# Patient Record
Sex: Female | Born: 1965 | Race: White | Hispanic: No | Marital: Married | Smoking: Never smoker
Health system: Southern US, Community
[De-identification: ages and names within clinical notes are randomized; demographics above are authoritative.]

## PROBLEM LIST (undated history)

## (undated) DIAGNOSIS — T7840XA Allergy, unspecified, initial encounter: Secondary | ICD-10-CM

## (undated) DIAGNOSIS — E079 Disorder of thyroid, unspecified: Secondary | ICD-10-CM

## (undated) DIAGNOSIS — M1990 Unspecified osteoarthritis, unspecified site: Secondary | ICD-10-CM

## (undated) DIAGNOSIS — M810 Age-related osteoporosis without current pathological fracture: Secondary | ICD-10-CM

## (undated) DIAGNOSIS — I10 Essential (primary) hypertension: Secondary | ICD-10-CM

## (undated) DIAGNOSIS — E669 Obesity, unspecified: Secondary | ICD-10-CM

## (undated) DIAGNOSIS — D649 Anemia, unspecified: Secondary | ICD-10-CM

## (undated) HISTORY — DX: Unspecified osteoarthritis, unspecified site: M19.90

## (undated) HISTORY — DX: Age-related osteoporosis without current pathological fracture: M81.0

## (undated) HISTORY — PX: OTHER SURGICAL HISTORY: SHX169

## (undated) HISTORY — DX: Allergy, unspecified, initial encounter: T78.40XA

## (undated) HISTORY — DX: Essential (primary) hypertension: I10

## (undated) HISTORY — DX: Disorder of thyroid, unspecified: E07.9

## (undated) HISTORY — DX: Obesity, unspecified: E66.9

## (undated) HISTORY — DX: Anemia, unspecified: D64.9

## (undated) HISTORY — PX: CERVICAL BIOPSY  W/ LOOP ELECTRODE EXCISION: SUR135

---

## 2000-10-11 ENCOUNTER — Other Ambulatory Visit: Admission: RE | Admit: 2000-10-11 | Discharge: 2000-10-11 | Payer: Self-pay | Admitting: Obstetrics and Gynecology

## 2001-12-09 ENCOUNTER — Other Ambulatory Visit: Admission: RE | Admit: 2001-12-09 | Discharge: 2001-12-09 | Payer: Self-pay | Admitting: Obstetrics and Gynecology

## 2002-01-07 ENCOUNTER — Ambulatory Visit (HOSPITAL_COMMUNITY): Admission: RE | Admit: 2002-01-07 | Discharge: 2002-01-07 | Payer: Self-pay | Admitting: Family Medicine

## 2002-01-07 ENCOUNTER — Encounter: Payer: Self-pay | Admitting: Family Medicine

## 2002-01-14 ENCOUNTER — Ambulatory Visit (HOSPITAL_COMMUNITY): Admission: RE | Admit: 2002-01-14 | Discharge: 2002-01-14 | Payer: Self-pay | Admitting: Family Medicine

## 2002-01-15 ENCOUNTER — Encounter: Payer: Self-pay | Admitting: Family Medicine

## 2002-03-05 ENCOUNTER — Other Ambulatory Visit: Admission: RE | Admit: 2002-03-05 | Discharge: 2002-03-05 | Payer: Self-pay | Admitting: Obstetrics and Gynecology

## 2002-03-17 ENCOUNTER — Ambulatory Visit (HOSPITAL_COMMUNITY): Admission: RE | Admit: 2002-03-17 | Discharge: 2002-03-17 | Payer: Self-pay | Admitting: Internal Medicine

## 2002-06-16 ENCOUNTER — Ambulatory Visit (HOSPITAL_COMMUNITY): Admission: RE | Admit: 2002-06-16 | Discharge: 2002-06-16 | Payer: Self-pay | Admitting: Internal Medicine

## 2002-06-29 ENCOUNTER — Encounter (HOSPITAL_COMMUNITY): Admission: RE | Admit: 2002-06-29 | Discharge: 2002-07-29 | Payer: Self-pay | Admitting: Internal Medicine

## 2002-12-15 ENCOUNTER — Other Ambulatory Visit: Admission: RE | Admit: 2002-12-15 | Discharge: 2002-12-15 | Payer: Self-pay | Admitting: Obstetrics and Gynecology

## 2004-06-13 ENCOUNTER — Other Ambulatory Visit: Admission: RE | Admit: 2004-06-13 | Discharge: 2004-06-13 | Payer: Self-pay | Admitting: Obstetrics and Gynecology

## 2005-05-22 ENCOUNTER — Other Ambulatory Visit: Admission: RE | Admit: 2005-05-22 | Discharge: 2005-05-22 | Payer: Self-pay | Admitting: Obstetrics and Gynecology

## 2006-10-01 ENCOUNTER — Encounter: Payer: Self-pay | Admitting: Family Medicine

## 2006-10-01 ENCOUNTER — Ambulatory Visit: Payer: Self-pay | Admitting: Family Medicine

## 2006-10-01 ENCOUNTER — Other Ambulatory Visit: Admission: RE | Admit: 2006-10-01 | Discharge: 2006-10-01 | Payer: Self-pay | Admitting: Family Medicine

## 2006-10-01 LAB — CONVERTED CEMR LAB
BUN: 10 mg/dL (ref 6–23)
Basophils Absolute: 0 10*3/uL (ref 0.0–0.1)
Basophils Relative: 0.4 % (ref 0.0–1.0)
Cholesterol: 279 mg/dL (ref 0–200)
Creatinine, Ser: 1.1 mg/dL (ref 0.4–1.2)
Eosinophils Absolute: 0.1 10*3/uL (ref 0.0–0.6)
HCT: 36.8 % (ref 36.0–46.0)
MCHC: 35.4 g/dL (ref 30.0–36.0)
MCV: 93.3 fL (ref 78.0–100.0)
Neutro Abs: 4 10*3/uL (ref 1.4–7.7)
Neutrophils Relative %: 60 % (ref 43.0–77.0)
Platelets: 234 10*3/uL (ref 150–400)
Potassium: 3.8 meq/L (ref 3.5–5.1)
TSH: 77.58 microintl units/mL — ABNORMAL HIGH (ref 0.35–5.50)
Total CHOL/HDL Ratio: 5.7
Triglycerides: 159 mg/dL — ABNORMAL HIGH (ref 0–149)
VLDL: 32 mg/dL (ref 0–40)

## 2006-10-08 ENCOUNTER — Ambulatory Visit: Payer: Self-pay | Admitting: Family Medicine

## 2006-10-16 ENCOUNTER — Encounter: Admission: RE | Admit: 2006-10-16 | Discharge: 2006-10-16 | Payer: Self-pay | Admitting: Family Medicine

## 2006-11-05 ENCOUNTER — Ambulatory Visit: Payer: Self-pay | Admitting: Family Medicine

## 2006-11-06 LAB — CONVERTED CEMR LAB: T3, Free: 2.5 pg/mL (ref 2.3–4.2)

## 2007-01-23 ENCOUNTER — Ambulatory Visit: Payer: Self-pay | Admitting: Family Medicine

## 2007-01-23 LAB — CONVERTED CEMR LAB: TSH: 12.83 microintl units/mL — ABNORMAL HIGH (ref 0.35–5.50)

## 2007-03-14 ENCOUNTER — Ambulatory Visit: Payer: Self-pay | Admitting: Family Medicine

## 2007-03-14 LAB — CONVERTED CEMR LAB: TSH: 6.58 microintl units/mL — ABNORMAL HIGH (ref 0.35–5.50)

## 2007-05-27 DIAGNOSIS — E039 Hypothyroidism, unspecified: Secondary | ICD-10-CM | POA: Insufficient documentation

## 2007-05-27 DIAGNOSIS — R51 Headache: Secondary | ICD-10-CM

## 2007-05-27 DIAGNOSIS — I10 Essential (primary) hypertension: Secondary | ICD-10-CM | POA: Insufficient documentation

## 2007-05-27 DIAGNOSIS — R519 Headache, unspecified: Secondary | ICD-10-CM | POA: Insufficient documentation

## 2007-09-17 ENCOUNTER — Telehealth: Payer: Self-pay | Admitting: Family Medicine

## 2007-09-19 ENCOUNTER — Ambulatory Visit: Payer: Self-pay | Admitting: Internal Medicine

## 2007-09-19 DIAGNOSIS — K120 Recurrent oral aphthae: Secondary | ICD-10-CM | POA: Insufficient documentation

## 2007-09-26 ENCOUNTER — Encounter: Admission: RE | Admit: 2007-09-26 | Discharge: 2007-09-26 | Payer: Self-pay | Admitting: Family Medicine

## 2007-09-26 ENCOUNTER — Ambulatory Visit: Payer: Self-pay | Admitting: Family Medicine

## 2007-09-26 LAB — CONVERTED CEMR LAB
ALT: 10 units/L (ref 0–35)
AST: 16 units/L (ref 0–37)
Basophils Relative: 0.3 % (ref 0.0–1.0)
Bilirubin, Direct: 0.1 mg/dL (ref 0.0–0.3)
CO2: 28 meq/L (ref 19–32)
Direct LDL: 164.5 mg/dL
Eosinophils Absolute: 0.1 10*3/uL (ref 0.0–0.6)
GFR calc Af Amer: 79 mL/min
GFR calc non Af Amer: 65 mL/min
HDL: 45.2 mg/dL (ref >39.0)
Hemoglobin: 12.2 g/dL (ref 12.0–15.0)
Lymphocytes Relative: 34.2 % (ref 12.0–46.0)
Monocytes Absolute: 0.4 10*3/uL (ref 0.2–0.7)
Monocytes Relative: 6.9 % (ref 3.0–11.0)
Neutro Abs: 3.7 10*3/uL (ref 1.4–7.7)
Neutrophils Relative %: 57.8 % (ref 43.0–77.0)
Platelets: 279 10*3/uL (ref 150–400)
Potassium: 4.2 meq/L (ref 3.5–5.1)
RBC: 3.91 M/uL (ref 3.87–5.11)
Sodium: 141 meq/L (ref 135–145)
Total Protein: 7.2 g/dL (ref 6.0–8.3)
Triglycerides: 113 mg/dL (ref 0–149)
VLDL: 23 mg/dL (ref 0–40)

## 2007-10-30 ENCOUNTER — Ambulatory Visit: Payer: Self-pay | Admitting: Family Medicine

## 2007-10-30 ENCOUNTER — Encounter: Payer: Self-pay | Admitting: Family Medicine

## 2007-10-30 DIAGNOSIS — F988 Other specified behavioral and emotional disorders with onset usually occurring in childhood and adolescence: Secondary | ICD-10-CM | POA: Insufficient documentation

## 2007-10-30 DIAGNOSIS — F341 Dysthymic disorder: Secondary | ICD-10-CM | POA: Insufficient documentation

## 2007-10-30 DIAGNOSIS — E663 Overweight: Secondary | ICD-10-CM | POA: Insufficient documentation

## 2007-10-31 ENCOUNTER — Other Ambulatory Visit: Admission: RE | Admit: 2007-10-31 | Discharge: 2007-10-31 | Payer: Self-pay | Admitting: Family Medicine

## 2008-09-21 ENCOUNTER — Emergency Department (HOSPITAL_COMMUNITY): Admission: EM | Admit: 2008-09-21 | Discharge: 2008-09-21 | Payer: Self-pay | Admitting: Family Medicine

## 2008-10-12 ENCOUNTER — Encounter: Admission: RE | Admit: 2008-10-12 | Discharge: 2008-10-12 | Payer: Self-pay | Admitting: Family Medicine

## 2009-02-04 ENCOUNTER — Ambulatory Visit: Payer: Self-pay | Admitting: Family Medicine

## 2009-02-04 LAB — CONVERTED CEMR LAB
ALT: 9 units/L (ref 0–35)
AST: 14 units/L (ref 0–37)
Albumin: 3.7 g/dL (ref 3.5–5.2)
Alkaline Phosphatase: 81 units/L (ref 39–117)
Bilirubin Urine: NEGATIVE
Calcium: 8.5 mg/dL (ref 8.4–10.5)
Chloride: 111 meq/L (ref 96–112)
Cholesterol: 179 mg/dL (ref 0–200)
Eosinophils Absolute: 0 10*3/uL (ref 0.0–0.7)
GFR calc non Af Amer: 72.66 mL/min (ref >60)
HDL: 41.8 mg/dL (ref >39.00)
Hemoglobin: 12.5 g/dL (ref 12.0–15.0)
Lymphocytes Relative: 32.5 % (ref 12.0–46.0)
Lymphs Abs: 1.1 10*3/uL (ref 0.7–4.0)
MCV: 94.5 fL (ref 78.0–100.0)
Monocytes Relative: 12.1 % — ABNORMAL HIGH (ref 3.0–12.0)
Neutrophils Relative %: 54.2 % (ref 43.0–77.0)
Nitrite: NEGATIVE
Platelets: 214 10*3/uL (ref 150.0–400.0)
RDW: 12.5 % (ref 11.5–14.6)
Specific Gravity, Urine: 1.025
Total Protein: 7.1 g/dL (ref 6.0–8.3)
Uric Acid, Serum: 5.2 mg/dL (ref 2.4–7.0)
Urobilinogen, UA: 0.2
VLDL: 21 mg/dL (ref 0.0–40.0)
WBC Urine, dipstick: NEGATIVE
pH: 7

## 2009-02-11 ENCOUNTER — Encounter: Payer: Self-pay | Admitting: Family Medicine

## 2009-02-11 ENCOUNTER — Other Ambulatory Visit: Admission: RE | Admit: 2009-02-11 | Discharge: 2009-02-11 | Payer: Self-pay | Admitting: Family Medicine

## 2009-02-11 ENCOUNTER — Ambulatory Visit: Payer: Self-pay | Admitting: Family Medicine

## 2009-02-18 ENCOUNTER — Telehealth: Payer: Self-pay | Admitting: *Deleted

## 2009-03-29 ENCOUNTER — Ambulatory Visit: Payer: Self-pay | Admitting: Family Medicine

## 2009-04-07 ENCOUNTER — Telehealth: Payer: Self-pay | Admitting: Family Medicine

## 2009-05-03 ENCOUNTER — Ambulatory Visit: Payer: Self-pay | Admitting: Family Medicine

## 2009-05-03 DIAGNOSIS — M65849 Other synovitis and tenosynovitis, unspecified hand: Secondary | ICD-10-CM

## 2009-05-03 DIAGNOSIS — M65839 Other synovitis and tenosynovitis, unspecified forearm: Secondary | ICD-10-CM | POA: Insufficient documentation

## 2009-05-11 ENCOUNTER — Ambulatory Visit: Payer: Self-pay | Admitting: Family Medicine

## 2009-05-11 DIAGNOSIS — R1013 Epigastric pain: Secondary | ICD-10-CM | POA: Insufficient documentation

## 2009-05-11 LAB — CONVERTED CEMR LAB
Alkaline Phosphatase: 94 units/L (ref 39–117)
Basophils Relative: 0.9 % (ref 0.0–3.0)
CO2: 26 meq/L (ref 19–32)
Calcium: 9.1 mg/dL (ref 8.4–10.5)
Creatinine, Ser: 0.8 mg/dL (ref 0.4–1.2)
Eosinophils Absolute: 0.3 10*3/uL (ref 0.0–0.7)
Hemoglobin: 13.3 g/dL (ref 12.0–15.0)
Lipase: 25 units/L (ref 11.0–59.0)
Lymphs Abs: 2.2 10*3/uL (ref 0.7–4.0)
Monocytes Absolute: 0.5 10*3/uL (ref 0.1–1.0)
Neutro Abs: 3.7 10*3/uL (ref 1.4–7.7)
Neutrophils Relative %: 54 % (ref 43.0–77.0)
RDW: 12.8 % (ref 11.5–14.6)
Total Bilirubin: 0.5 mg/dL (ref 0.3–1.2)
Total Protein: 7.3 g/dL (ref 6.0–8.3)

## 2009-05-12 ENCOUNTER — Encounter: Admission: RE | Admit: 2009-05-12 | Discharge: 2009-05-12 | Payer: Self-pay | Admitting: Family Medicine

## 2009-05-13 ENCOUNTER — Ambulatory Visit (HOSPITAL_COMMUNITY): Admission: RE | Admit: 2009-05-13 | Discharge: 2009-05-13 | Payer: Self-pay | Admitting: Family Medicine

## 2009-05-16 ENCOUNTER — Ambulatory Visit: Payer: Self-pay | Admitting: Family Medicine

## 2009-10-11 ENCOUNTER — Encounter: Admission: RE | Admit: 2009-10-11 | Discharge: 2009-10-11 | Payer: Self-pay | Admitting: Family Medicine

## 2010-04-03 ENCOUNTER — Ambulatory Visit: Payer: Self-pay | Admitting: Family Medicine

## 2010-04-03 LAB — CONVERTED CEMR LAB
ALT: 12 units/L (ref 0–35)
AST: 19 units/L (ref 0–37)
Albumin: 3.9 g/dL (ref 3.5–5.2)
BUN: 14 mg/dL (ref 6–23)
Basophils Absolute: 0 10*3/uL (ref 0.0–0.1)
Basophils Relative: 0.4 % (ref 0.0–3.0)
Bilirubin, Direct: 0.1 mg/dL (ref 0.0–0.3)
CO2: 29 meq/L (ref 19–32)
Calcium: 8.8 mg/dL (ref 8.4–10.5)
Chloride: 104 meq/L (ref 96–112)
Cholesterol: 253 mg/dL — ABNORMAL HIGH (ref 0–200)
Creatinine, Ser: 1 mg/dL (ref 0.4–1.2)
HCT: 34.9 % — ABNORMAL LOW (ref 36.0–46.0)
Hemoglobin: 11.9 g/dL — ABNORMAL LOW (ref 12.0–15.0)
Ketones, urine, test strip: NEGATIVE
Lymphocytes Relative: 33.8 % (ref 12.0–46.0)
Lymphs Abs: 1.9 10*3/uL (ref 0.7–4.0)
Neutro Abs: 3.4 10*3/uL (ref 1.4–7.7)
Nitrite: NEGATIVE
Platelets: 261 10*3/uL (ref 150.0–400.0)
Triglycerides: 228 mg/dL — ABNORMAL HIGH (ref 0.0–149.0)
Urobilinogen, UA: 0.2
WBC Urine, dipstick: NEGATIVE
WBC: 5.7 10*3/uL (ref 4.5–10.5)

## 2010-04-29 DIAGNOSIS — IMO0002 Reserved for concepts with insufficient information to code with codable children: Secondary | ICD-10-CM | POA: Insufficient documentation

## 2010-05-03 ENCOUNTER — Ambulatory Visit: Payer: Self-pay | Admitting: Family Medicine

## 2010-05-04 ENCOUNTER — Encounter: Payer: Self-pay | Admitting: Family Medicine

## 2010-06-07 ENCOUNTER — Ambulatory Visit: Payer: Self-pay | Admitting: Family Medicine

## 2010-06-07 ENCOUNTER — Other Ambulatory Visit: Admission: RE | Admit: 2010-06-07 | Discharge: 2010-06-07 | Payer: Self-pay | Admitting: Family Medicine

## 2010-06-07 DIAGNOSIS — N949 Unspecified condition associated with female genital organs and menstrual cycle: Secondary | ICD-10-CM

## 2010-06-07 DIAGNOSIS — N938 Other specified abnormal uterine and vaginal bleeding: Secondary | ICD-10-CM | POA: Insufficient documentation

## 2010-06-07 LAB — CONVERTED CEMR LAB: Pap Smear: NEGATIVE

## 2010-06-08 ENCOUNTER — Encounter: Admission: RE | Admit: 2010-06-08 | Discharge: 2010-06-08 | Payer: Self-pay | Admitting: Nurse Practitioner

## 2010-06-12 ENCOUNTER — Ambulatory Visit: Payer: Self-pay | Admitting: Family Medicine

## 2010-06-23 ENCOUNTER — Ambulatory Visit: Payer: Self-pay | Admitting: Family Medicine

## 2010-06-23 DIAGNOSIS — R609 Edema, unspecified: Secondary | ICD-10-CM | POA: Insufficient documentation

## 2010-06-26 ENCOUNTER — Ambulatory Visit: Payer: Self-pay | Admitting: Family Medicine

## 2010-06-27 ENCOUNTER — Telehealth: Payer: Self-pay | Admitting: Family Medicine

## 2010-09-17 ENCOUNTER — Encounter: Payer: Self-pay | Admitting: Family Medicine

## 2010-09-26 NOTE — Assessment & Plan Note (Signed)
Summary: bilateral leg swelling/dm   Vital Signs:  Patient profile:   45 year old female Weight:      265 pounds Temp:     98.5 degrees F oral BP sitting:   140 / 84  (left arm) Cuff size:   regular  Vitals Entered By: Kern Reap CMA Duncan Dull) (June 23, 2010 9:06 AM) CC: swelling with feet   CC:  swelling with feet.  History of Present Illness: Maria Boone is a delightful, 45 year old, married female, who comes in today accompanied by her husband for evaluation of obesity, and bilateral lower extremity edema.  Since she was here two weeks ago.  She gained 4 pounds and, now she's noticed her legs are more puffy.  She is not following a caloric restricted diet nor is she decreasing her salt intake.  She drinks lots of sodas.  Review of systems otherwise negative  Allergies: 1)  ! Amoxicillin  Past History:  Past medical, surgical, family and social histories (including risk factors) reviewed for relevance to current acute and chronic problems.  Past Medical History: Reviewed history from 10/30/2007 and no changes required. Migraines Obese Headache Hypertension Hypothyroidism childbirth x 2  Past Surgical History: Reviewed history from 05/27/2007 and no changes required. CB x2  Family History: Reviewed history from 05/27/2007 and no changes required. Family History Hypertension Family History Kidney disease Family History of Neurological disorder Family History of Lymphoma  Social History: Reviewed history from 05/27/2007 and no changes required. Occupation: Married Never Smoked Alcohol use-no Drug use-no  Review of Systems      See HPI  Physical Exam  General:  Well-developed,well-nourished,in no acute distress; alert,appropriate and cooperative throughout examination Extremities:  1+ left pedal edema and 1+ right pedal edema.     Problems:  Medical Problems Added: 1)  Dx of Edema- Localized  (ICD-782.3)  Impression & Recommendations:  Problem  # 1:  EDEMA- LOCALIZED (ICD-782.3) Assessment New  Her updated medication list for this problem includes:    Maxzide-25 37.5-25 Mg Tabs (Triamterene-hctz) .Marland Kitchen... Take 1 tablet by mouth every morning  Orders: Nutrition Referral (Nutrition)  Complete Medication List: 1)  Zomig Zmt 5 Mg Tbdp (Zolmitriptan) .... As needed 2)  Zestril 10 Mg Tabs (Lisinopril) .... Take 1 tablet by mouth every morning 3)  Ritalin 10 Mg Tabs (Methylphenidate hcl) .... Take 1 tablet by mouth every morning 4)  Synthroid 125 Mcg Tabs (Levothyroxine sodium) .... Take two tabs once daily 5)  Maxzide-25 37.5-25 Mg Tabs (Triamterene-hctz) .... Take 1 tablet by mouth every morning  Patient Instructions: 1)  begin a 1500-calorie, no salt diet. 2)  Walk 15 minutes daily. 3)  Take hydrochlorothiazide 25 mg daily in the morning. 4)  We will get u  all set up to see a nutritionist to help facilitate the caloric &  salt decrease 5)  Return in one month for follow Prescriptions: MAXZIDE-25 37.5-25 MG TABS (TRIAMTERENE-HCTZ) Take 1 tablet by mouth every morning  #100 x 3   Entered and Authorized by:   Roderick Pee MD   Signed by:   Roderick Pee MD on 06/23/2010   Method used:   Electronically to        [facility] [address] [phone]* (retail)       [address]       [place], [place]  [address]       Ph: [phone] or [phone]  Fax: [phone]   RxID:   [id number]    Orders Added: 1)  Est. Patient Level III [30865] 2)  Nutrition Referral [Nutrition]

## 2010-09-26 NOTE — Progress Notes (Signed)
Summary: wants a different rx for cough  Phone Note Call from Patient Call back at Home Phone [phone]   Caller: Patient----triage vm Reason for Call: Talk to Nurse Summary of Call: was rx'd Hydromet. Then was rx'd Phenergan as well. she is requesting another rx, that does'nt make her nauseated and dizzy. Initial call taken by: Warnell Forester,  June 27, 2010 1:24 PM  Follow-up for Phone Call        Tessalon pearls, dispense 50 directions one q.i.d. p.r.n. for cough, refills x 1 Follow-up by: Roderick Pee MD,  June 27, 2010 3:39 PM  Additional Follow-up for Phone Call Additional follow up Details #1::        Phone Call Completed, Rx Called In Additional Follow-up by: Kern Reap CMA Duncan Dull),  June 27, 2010 4:52 PM

## 2010-09-26 NOTE — Assessment & Plan Note (Signed)
Summary: 2 ROV FOR PAP ONLY//SLM/pt rescd//ccm----PT [facility] // RS   Vital Signs:  Patient profile:   45 year old female Height:      64 inches Weight:      261 pounds BMI:     44.96 Temp:     98.2 degrees F oral Pulse rate:   70 / minute Pulse rhythm:   regular Resp:     12 per minute BP sitting:   156 / 80  Vitals Entered By: Lynann Beaver CMA (June 07, 2010 8:59 AM) CC: cpx Is Patient Diabetic? No Pain Assessment Patient in pain? no        CC:  cpx.  History of Present Illness: Maria Boone is a 45 year old, married female, G2, P2, nonsmoker, who comes in today for general physical examination because of underlying hypertension, migraine headaches, hyporoidism, and ADD.  She also is an issue with her weight.  She is currently at 261 pounds.  She takes Zomig p.r.n. for migraines, Zestril, 10 mg for hypertension...Marland KitchenMarland KitchenMarland Kitchen BP 156/80.  Should this and monitoring at home.. rhythm 10 mg nightly for ADD.  Synthroid 250 micrograms daily for hypothyroidism TSH level is normal.  Will continue the above does  She had some dysfunction of bleeding with the last.  She started normally then had some flooding.  For birth, control her husband had a vasectomy.  She gets routine eye care, dental care, BSE monthly, any mammography, tetanus, 2008, seasonal flu 2011  She states she has recently been under a lot of stress.  She is an only child.  Her father's been on dialysis for 32 years and is decided to stop.  He is under the care of hospice  Current Medications (verified): 1)  Zomig Zmt 5 Mg  Tbdp (Zolmitriptan) .... As Needed 2)  Zestril 10 Mg  Tabs (Lisinopril) .... Take 1 Tablet By Mouth Every Morning 3)  Ritalin 10 Mg  Tabs (Methylphenidate Hcl) .... Take 1 Tablet By Mouth Every Morning 4)  Synthroid 125 Mcg Tabs (Levothyroxine Sodium) .... Take Two Tabs Once Daily  Allergies (verified): 1)  ! Amoxicillin  Past History:  Past medical, surgical, family and social histories (including risk  factors) reviewed, and no changes noted (except as noted below).  Past Medical History: Reviewed history from 10/30/2007 and no changes required. Migraines Obese Headache Hypertension Hypothyroidism childbirth x 2  Past Surgical History: Reviewed history from 05/27/2007 and no changes required. CB x2  Family History: Reviewed history from 05/27/2007 and no changes required. Family History Hypertension Family History Kidney disease Family History of Neurological disorder Family History of Lymphoma  Social History: Reviewed history from 05/27/2007 and no changes required. Occupation: Married Never Smoked Alcohol use-no Drug use-no  Review of Systems      See HPI  Physical Exam  General:  Well-developed,well-nourished,in no acute distress; alert,appropriate and cooperative throughout examination Head:  Normocephalic and atraumatic without obvious abnormalities. No apparent alopecia or balding. Eyes:  No corneal or conjunctival inflammation noted. EOMI. Perrla. Funduscopic exam benign, without hemorrhages, exudates or papilledema. Vision grossly normal. Ears:  External ear exam shows no significant lesions or deformities.  Otoscopic examination reveals clear canals, tympanic membranes are intact bilaterally without bulging, retraction, inflammation or discharge. Hearing is grossly normal bilaterally. Nose:  External nasal examination shows no deformity or inflammation. Nasal mucosa are pink and moist without lesions or exudates. Mouth:  Oral mucosa and oropharynx without lesions or exudates.  Teeth in good repair. Neck:  No deformities, masses, or tenderness noted.  Chest Wall:  No deformities, masses, or tenderness noted. Breasts:  normal exam except for an area 5-mm round erythema.  Left breast at the 9 clock position.  She, thinks it's just a little pimple advised to watch it if it does not resolve in a week to 10 days to return Lungs:  Normal respiratory effort, chest  expands symmetrically. Lungs are clear to auscultation, no crackles or wheezes. Heart:  Normal rate and regular rhythm. S1 and S2 normal without gallop, murmur, click, rub or other extra sounds. Abdomen:  Bowel sounds positive,abdomen soft and non-tender without masses, organomegaly or hernias noted. Rectal:  No external abnormalities noted. Normal sphincter tone. No rectal masses or tenderness. Genitalia:  the uterus is markedly enlarged, most likely fibroids.  Will order an ultrasound Msk:  No deformity or scoliosis noted of thoracic or lumbar spine.   Pulses:  R and L carotid,radial,femoral,dorsalis pedis and posterior tibial pulses are full and equal bilaterally Extremities:  No clubbing, cyanosis, edema, or deformity noted with normal full range of motion of all joints.   Neurologic:  No cranial nerve deficits noted. Station and gait are normal. Plantar reflexes are down-going bilaterally. DTRs are symmetrical throughout. Sensory, motor and coordinative functions appear intact. Skin:  Intact without suspicious lesions or rashes Cervical Nodes:  No lymphadenopathy noted Axillary Nodes:  No palpable lymphadenopathy Inguinal Nodes:  No significant adenopathy Psych:  Cognition and judgment appear intact. Alert and cooperative with normal attention span and concentration. No apparent delusions, illusions, hallucinations   Impression & Recommendations:  Problem # 1:  ADD (ICD-314.00) Assessment Improved  Orders: Prescription Created Electronically [phone])  Problem # 2:  OVERWEIGHT (ICD-278.02) Assessment: Unchanged  Orders: Prescription Created Electronically [phone])  Problem # 3:  HYPOTHYROIDISM (ICD-244.9) Assessment: Improved  Her updated medication list for this problem includes:    Synthroid 125 Mcg Tabs (Levothyroxine sodium) .Marland Kitchen... Take two tabs once daily  Orders: Prescription Created Electronically [phone])  Problem # 4:  HYPERTENSION (ICD-401.9) Assessment:  Improved  Her updated medication list for this problem includes:    Zestril 10 Mg Tabs (Lisinopril) .Marland Kitchen... Take 1 tablet by mouth every morning  Orders: Prescription Created Electronically [phone])  Problem # 5:  HEADACHE (ICD-784.0) Assessment: Improved  Her updated medication list for this problem includes:    Zomig Zmt 5 Mg Tbdp (Zolmitriptan) .Marland Kitchen... As needed  Orders: Prescription Created Electronically [phone])  Problem # 6:  DYSFUNCTIONAL UTERINE BLEEDING (ICD-626.8) Assessment: New  Orders: Radiology Referral (Radiology)  Complete Medication List: 1)  Zomig Zmt 5 Mg Tbdp (Zolmitriptan) .... As needed 2)  Zestril 10 Mg Tabs (Lisinopril) .... Take 1 tablet by mouth every morning 3)  Ritalin 10 Mg Tabs (Methylphenidate hcl) .... Take 1 tablet by mouth every morning 4)  Synthroid 125 Mcg Tabs (Levothyroxine sodium) .... Take two tabs once daily  Other Orders: Admin 1st Vaccine ([address]) Flu Vaccine 54yrs + ([address]846)  Patient Instructions: 1)  we will get to set up for an ultrasound of uterus returned 4 to 5 days after the ultrasound for follow-up. 2)  It is important that you exercise regularly at least 20 minutes 5 times a week. If you develop chest pain, have severe difficulty breathing, or feel very tired , stop exercising immediately and seek medical attention. 3)  You need to lose weight. Consider a lower calorie diet and regular exercise.  4)  Schedule your mammogram. 5)  Take an Aspirin every day. 6)  continue current blood pressure medication, however, check a  daily blood pressure in the morning.  When u  return  for follow-up bring a record of all your blood pressure readings and the device Flu Vaccine Consent Questions     Do you have a history of severe allergic reactions to this vaccine? no    Any prior history of allergic reactions to egg and/or gelatin? no    Do you have a sensitivity to the preservative Thimersol? no    Do you have a past history of Guillan-Barre  Syndrome? no    Do you currently have an acute febrile illness? no    Have you ever had a severe reaction to latex? no    Vaccine information given and explained to patient? yes    Are you currently pregnant? no    Lot Number:[id number]   Exp Date:02/24/2011   Site Given  Left Deltoid IMPrescriptions: RITALIN 10 MG  TABS (METHYLPHENIDATE HCL) Take 1 tablet by mouth every morning  #100 x 0   Entered and Authorized by:   Roderick Pee MD   Signed by:   Roderick Pee MD on 06/07/2010   Method used:   Print then Give to Patient   RxID:   [id number] SYNTHROID 125 MCG TABS (LEVOTHYROXINE SODIUM) take two tabs once daily  #200 x 3   Entered and Authorized by:   Roderick Pee MD   Signed by:   Roderick Pee MD on 06/07/2010   Method used:   Electronically to        [facility] [address] [phone]* (retail)       [address]       [place], [place]  [address]       Ph: [phone] or [phone]       Fax: [phone]   RxID:   [id number] ZESTRIL 10 MG  TABS (LISINOPRIL) Take 1 tablet by mouth every morning  #100 x 3   Entered and Authorized by:   Roderick Pee MD   Signed by:   Roderick Pee MD on 06/07/2010   Method used:   Electronically to        [facility] [address] [phone]* (retail)       [address]       [place], [place]  [address]       Ph: [phone] or [phone]       Fax: [phone]   RxID:   [id number] ZOMIG ZMT 5 MG  TBDP (ZOLMITRIPTAN) as needed  #6 x 2   Entered and Authorized by:   Roderick Pee MD   Signed by:   Roderick Pee MD on 06/07/2010   Method used:   Electronically to        [facility] [address] [phone]* (retail)       [address]       [place], [place]  [address]       Ph: [phone] or [phone]       Fax: [phone]   RxID:   [id number]    .[email or web address]

## 2010-09-26 NOTE — Progress Notes (Signed)
Summary: Call A Nurse   Call-A-Nurse Triage Call Report Triage Record Num: [id number] Operator: Albertine Grates Patient Name: Maria Boone Call Date & Time: 06/26/2010 10:29:31PM Patient Phone: [phone] PCP: Eugenio Hoes. Todd Patient Gender: Female PCP Fax : [phone] Patient DOB: 1965/12/30 [facility] Name[facility] Reason for Call: Husband calling and states began taking Hydromet 10-31 for cough 10-31. Has caused nausea. Has not vomited but nausea is severe. Has tried saltines but nothing has helped. Phenergan 25mg  po q4 hrs prn nausea #6 called to [phone]. Advised follow up with MD 11-1 for replacement cough med. Protocol(s) Used: Nausea or Vomiting Recommended Outcome per Protocol: Call Provider within 24 Hours Reason for Outcome: Symptoms began after starting or changing dose of prescription, nonprescription, alternative medication, or illicit drug Care Advice: Nausea Care Advice: - Drink small amounts of clear, sweetened liquids or ice cold drinks. - Eat light, bland foods such as saltine crackers or plain bread. - Do not eat high fat, highly seasoned, high fiber, or high sugar content foods. - Avoid mixing hot food and cold foods. - Eat smaller, more frequent meals. - Rest as much as possible in a sitting or in a propped lying position. Do not lie flat for at least 2 hours after eating. - Do not take pain medication (such as aspirin, NSAIDs) while nauseated. - Rest as much as possible until symptoms improve since activity may worsen nausea.  ~ 06/26/2010 10:40:21PM Page 1 of 1 [email or web address]

## 2010-09-26 NOTE — Assessment & Plan Note (Signed)
Summary: dry cough/st/chest discomfort/njr   Vital Signs:  Patient profile:   45 year old female Weight:      257 pounds Temp:     98.7 degrees F oral BP sitting:   124 / 84  (left arm) Cuff size:   regular  Vitals Entered By: Kern Reap CMA Duncan Dull) (June 26, 2010 9:13 AM) CC:  chest congestion   CC:   chest congestion.  History of Present Illness: Maria Boone is a 45 year old, married female, with a weeks history of cough.  No fever, earache, et Karie Soda.  No history of asthma.  She is a nonsmoker  Allergies: 1)  ! Amoxicillin  Social History: Reviewed history from 05/27/2007 and no changes required. Occupation: Married Never Smoked Alcohol use-no Drug use-no  Review of Systems      See HPI  Physical Exam  General:  Well-developed,well-nourished,in no acute distress; alert,appropriate and cooperative throughout examination Head:  Normocephalic and atraumatic without obvious abnormalities. No apparent alopecia or balding. Eyes:  No corneal or conjunctival inflammation noted. EOMI. Perrla. Funduscopic exam benign, without hemorrhages, exudates or papilledema. Vision grossly normal. Ears:  External ear exam shows no significant lesions or deformities.  Otoscopic examination reveals clear canals, tympanic membranes are intact bilaterally without bulging, retraction, inflammation or discharge. Hearing is grossly normal bilaterally. Nose:  External nasal examination shows no deformity or inflammation. Nasal mucosa are pink and moist without lesions or exudates. Mouth:  Oral mucosa and oropharynx without lesions or exudates.  Teeth in good repair. Neck:  No deformities, masses, or tenderness noted. Chest Wall:  No deformities, masses, or tenderness noted. Lungs:  Normal respiratory effort, chest expands symmetrically. Lungs are clear to auscultation, no crackles or wheezes.   Problems:  Medical Problems Added: 1)  Dx of Cough  (ICD-786.2)  Impression &  Recommendations:  Problem # 1:  COUGH (ICD-786.2) Assessment New  Complete Medication List: 1)  Zomig Zmt 5 Mg Tbdp (Zolmitriptan) .... As needed 2)  Zestril 10 Mg Tabs (Lisinopril) .... Take 1 tablet by mouth every morning 3)  Ritalin 10 Mg Tabs (Methylphenidate hcl) .... Take 1 tablet by mouth every morning 4)  Synthroid 125 Mcg Tabs (Levothyroxine sodium) .... Take two tabs once daily 5)  Maxzide-25 37.5-25 Mg Tabs (Triamterene-hctz) .... Take 1 tablet by mouth every morning 6)  Hydromet 5-1.5 Mg/70ml Syrp (Hydrocodone-homatropine) .... 1/2 to 1 tsp at bedtime as needed  Patient Instructions: 1)  drink 30 ounces of water daily, Hydromet one half to 1 teaspoon at bedtime as needed for cough  Prescriptions: HYDROMET 5-1.5 MG/5ML SYRP (HYDROCODONE-HOMATROPINE) 1/2 to 1 tsp at bedtime as needed  #8oz x 1   Entered and Authorized by:   Roderick Pee MD   Signed by:   Roderick Pee MD on 06/26/2010   Method used:   Print then Give to Patient   RxID:   [id number]    Orders Added: 1)  Est. Patient Level III [69678]

## 2010-09-26 NOTE — Assessment & Plan Note (Signed)
Summary: bump or ? boil under lft arm/painful/cjr   Vital Signs:  Patient profile:   45 year old female Weight:      262 pounds Temp:     98.2 degrees F oral BP sitting:   130 / 90  (left arm) Cuff size:   regular  Vitals Entered By: Kathrynn Speed CMA (May 03, 2010 9:54 AM) CC: Bump, boil, under Left Arm, x 5 days, src   CC:  Bump, boil, under Left Arm, x 5 days, and src.  History of Present Illness: Maria Boone is a 45 year old female, who comes in today for evaluation of a boil under her left arm and refills on her medication.  She said her boil under left arm for the past 5 days.  No history of MRSA.  She takes Zomig.  For migraine headaches, Zestril, 10 mg daily for hypertension, Ritalin, 10 mg daily for ADD and Synthroid 250 micrograms daily, however, she is not taking her Synthroid in over two months.  Her TSH level is 80  Current Medications (verified): 1)  Zomig Zmt 5 Mg  Tbdp (Zolmitriptan) .... As Needed 2)  Zestril 10 Mg  Tabs (Lisinopril) .... Take 1 Tablet By Mouth Every Morning 3)  Ritalin 10 Mg  Tabs (Methylphenidate Hcl) .... Take 1 Tablet By Mouth Every Morning 4)  Synthroid 125 Mcg Tabs (Levothyroxine Sodium) .... Take Two Tabs Once Daily  Allergies (verified): 1)  ! Amoxicillin  Past History:  Past medical, surgical, family and social histories (including risk factors) reviewed for relevance to current acute and chronic problems.  Past Medical History: Reviewed history from 10/30/2007 and no changes required. Migraines Obese Headache Hypertension Hypothyroidism childbirth x 2  Past Surgical History: Reviewed history from 05/27/2007 and no changes required. CB x2  Family History: Reviewed history from 05/27/2007 and no changes required. Family History Hypertension Family History Kidney disease Family History of Neurological disorder Family History of Lymphoma  Social History: Reviewed history from 05/27/2007 and no changes  required. Occupation: Married Never Smoked Alcohol use-no Drug use-no  Review of Systems      See HPI  Physical Exam  General:  Well-developed,well-nourished,in no acute distress; alert,appropriate and cooperative throughout examination Skin:  boil left axillary area.  The head was cleaned with alcohol.  Culture was done   Problems:  Medical Problems Added: 1)  Dx of Cellulitis/abscess, Arm  (ICD-682.3)  Impression & Recommendations:  Problem # 1:  CELLULITIS/ABSCESS, ARM (ICD-682.3) Assessment New  Her updated medication list for this problem includes:    Septra Ds 800-160 Mg Tabs (Sulfamethoxazole-trimethoprim) .Marland Kitchen... Take 1 tablet by mouth two times a day    Doxycycline Hyclate 100 Mg Caps (Doxycycline hyclate) .Marland Kitchen... Take 1 tablet by mouth two times a day  Orders: Prescription Created Electronically [id number]) Specimen Handling ([id number]) T-Culture, Wound ([id number])  Complete Medication List: 1)  Zomig Zmt 5 Mg Tbdp (Zolmitriptan) .... As needed 2)  Zestril 10 Mg Tabs (Lisinopril) .... Take 1 tablet by mouth every morning 3)  Ritalin 10 Mg Tabs (Methylphenidate hcl) .... Take 1 tablet by mouth every morning 4)  Synthroid 125 Mcg Tabs (Levothyroxine sodium) .... Take two tabs once daily 5)  Septra Ds 800-160 Mg Tabs (Sulfamethoxazole-trimethoprim) .... Take 1 tablet by mouth two times a day 6)  Doxycycline Hyclate 100 Mg Caps (Doxycycline hyclate) .... Take 1 tablet by mouth two times a day  Patient Instructions: 1)  begin Septra, and doxycycline one of each twice daily warm soaks 4 times a  day x 30 minutes. 2)  Restart her medication. 3)  Set up a time 30 minutes.  The fourth week in September for your annual physical exam. 4)  Also, take one iron tablet daily Prescriptions: RITALIN 10 MG  TABS (METHYLPHENIDATE HCL) Take 1 tablet by mouth every morning  #100 x 0   Entered and Authorized by:   Roderick Pee MD   Signed by:   Roderick Pee MD on 05/03/2010    Method used:   Print then Give to Patient   RxID:   [id number] SYNTHROID 125 MCG TABS (LEVOTHYROXINE SODIUM) take two tabs once daily  #200 x 3   Entered and Authorized by:   Roderick Pee MD   Signed by:   Roderick Pee MD on 05/03/2010   Method used:   Electronically to        [facility] [address] [phone]* (retail)       [address]       [place], [place]  [address]       Ph: [phone] or [phone]       Fax: [phone]   RxID:   [id number] ZESTRIL 10 MG  TABS (LISINOPRIL) Take 1 tablet by mouth every morning  #100 x 3   Entered and Authorized by:   Roderick Pee MD   Signed by:   Roderick Pee MD on 05/03/2010   Method used:   Electronically to        [facility] [address] [phone]* (retail)       [address]       [place], [place]  [address]       Ph: [phone] or [phone]       Fax: [phone]   RxID:   [id number] ZOMIG ZMT 5 MG  TBDP (ZOLMITRIPTAN) as needed  #6 x 2   Entered and Authorized by:   Roderick Pee MD   Signed by:   Roderick Pee MD on 05/03/2010   Method used:   Electronically to        [facility] [address] [phone]* (retail)       [address]       [place], [place]  [address]       Ph: [phone] or [phone]       Fax: [phone]   RxID:   [id number] DOXYCYCLINE HYCLATE 100 MG CAPS (DOXYCYCLINE HYCLATE) Take 1 tablet by mouth two times a day  #30 x 2   Entered and Authorized by:   Roderick Pee MD   Signed by:   Roderick Pee MD on 05/03/2010   Method used:   Electronically to        [facility] [address] [phone]* (retail)       [address]       [place], [place]  [address]       Ph: [phone] or [phone]       Fax: [phone]   RxID:   [id number] SEPTRA DS 800-160 MG TABS (SULFAMETHOXAZOLE-TRIMETHOPRIM) Take 1 tablet by mouth two times a day  #30 x 2   Entered and Authorized by:   Roderick Pee MD   Signed by:    Roderick Pee MD on 05/03/2010  Method used:   Electronically to        [facility]. [address] [phone]* (retail)       [address]       [place], [place]  [address]       Ph: [phone] or [phone]       Fax: [phone]   RxID:   [id number]

## 2010-09-26 NOTE — Assessment & Plan Note (Signed)
Summary: 3-4 DAY F/U  (F/U ON [place] RESULTS) // RS   Vital Signs:  Patient profile:   45 year old female BP sitting:   130 / 90  (left arm)  Vitals Entered By: Kern Reap CMA Duncan Dull) (June 12, 2010 9:01 AM) CC: follow-up visit   CC:  follow-up visit.  History of Present Illness: Maria Boone is a 45 year old female, married, and nonsmoker, who comes in today accompanied by her husband for reevaluation of dysfunction uterine bleeding.  As noticed in her previous note we saw her a week ago for dysfunction uterine bleeding.  At that time.  Her father was in the process of dying.  He been on dialysis for 32 years.  He decided to stop dialysis and died in two weeks.  Maria Boone is doing okay with this hour.  She feels like her daughter Maria Boone is having a lot of difficulty dealing with her grandfather's death.  The pelvic ultrasound was normal.  We discussed risks, options.  We elect to watch and wait  Allergies: 1)  ! Amoxicillin  Review of Systems      See HPI  Physical Exam  General:  Well-developed,well-nourished,in no acute distress; alert,appropriate and cooperative throughout examination   Impression & Recommendations:  Problem # 1:  DYSFUNCTIONAL UTERINE BLEEDING (ICD-626.8) Assessment Unchanged  Complete Medication List: 1)  Zomig Zmt 5 Mg Tbdp (Zolmitriptan) .... As needed 2)  Zestril 10 Mg Tabs (Lisinopril) .... Take 1 tablet by mouth every morning 3)  Ritalin 10 Mg Tabs (Methylphenidate hcl) .... Take 1 tablet by mouth every morning 4)  Synthroid 125 Mcg Tabs (Levothyroxine sodium) .... Take two tabs once daily  Patient Instructions: 1)  Please schedule a follow-up appointment as needed.   Orders Added: 1)  Est. Patient Level III [[address]]

## 2010-10-14 IMAGING — US US ABDOMEN COMPLETE
1 series · 14 of 25 positions shown · non-contrast
Comparison: [HOSPITAL] abdominal ultrasound report
01/07/2002.

CLINICAL DATA: Mid epigastric and right upper quadrant abdominal
pain.

COMPLETE ABDOMINAL ULTRASOUND

[Series 1: us abdomen complete · 0.28mm/px · 14 of 81 slices shown]
[im 1/81]
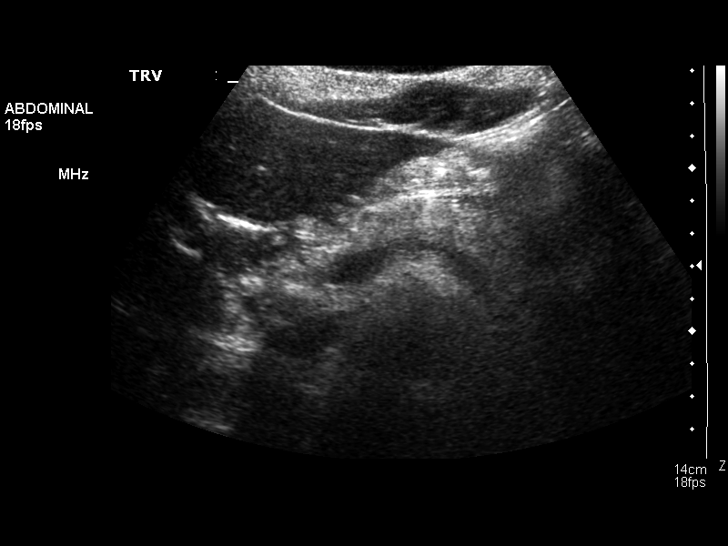
[im 7/81]
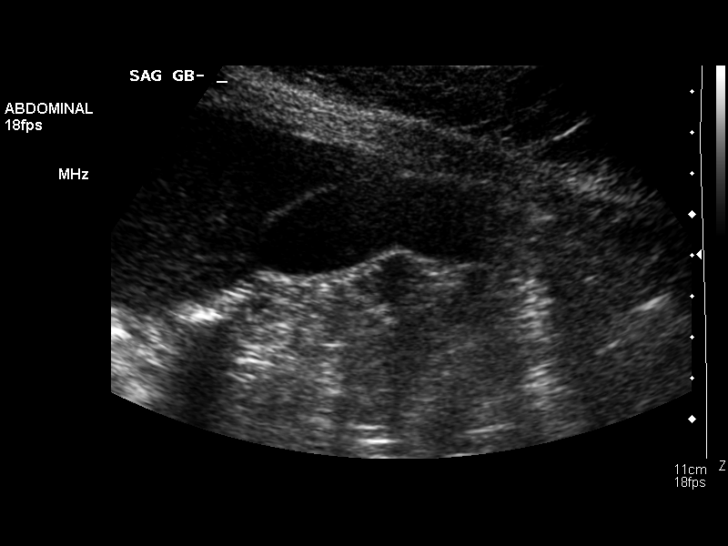
[im 14/81]
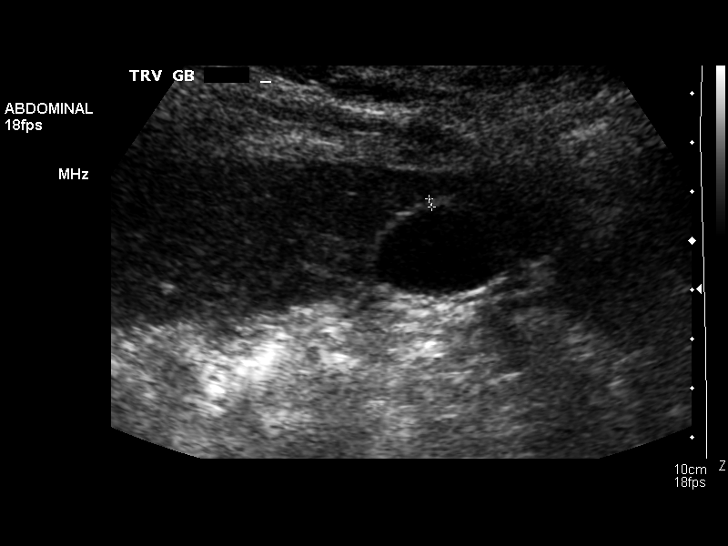
[im 21/81]
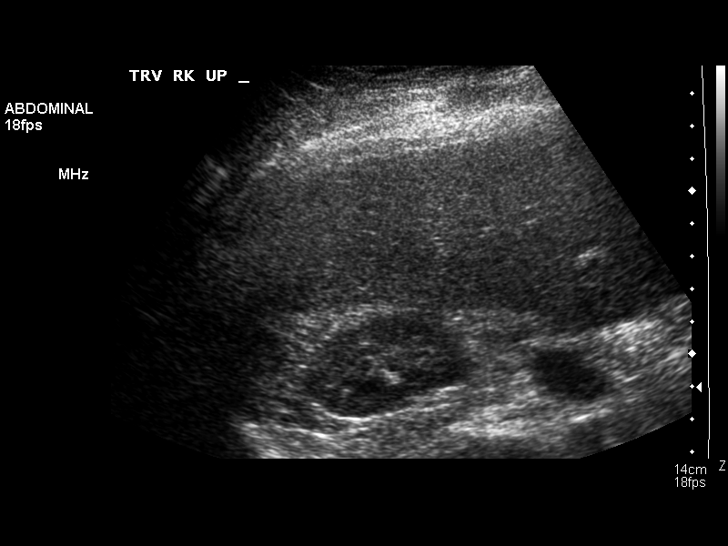
[im 27/81]
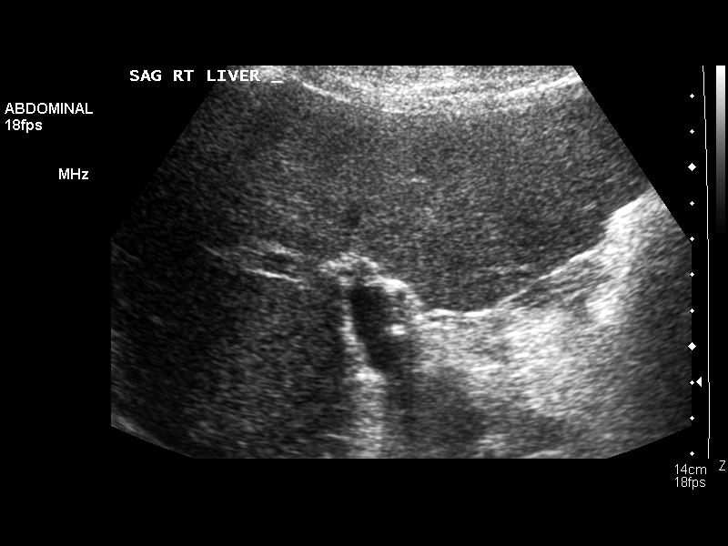
[im 31/81]
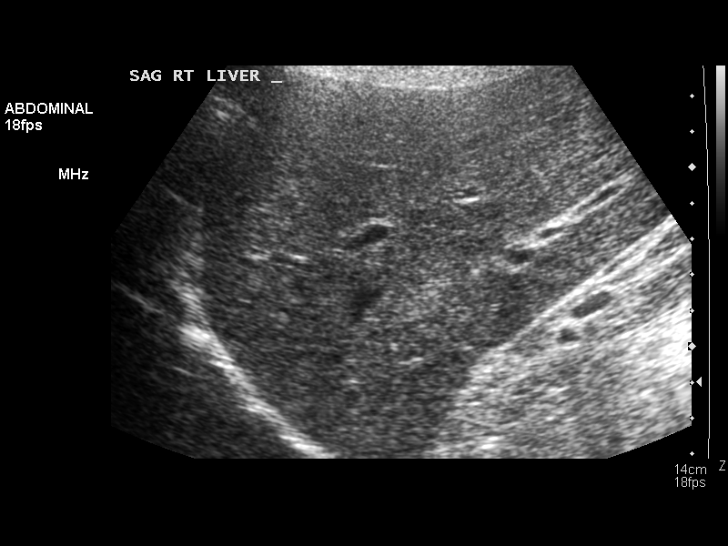
[im 37/81]
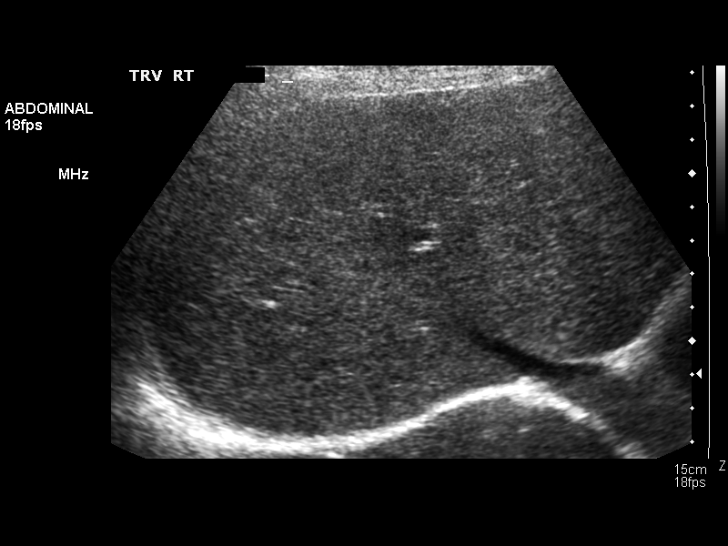
[im 44/81]
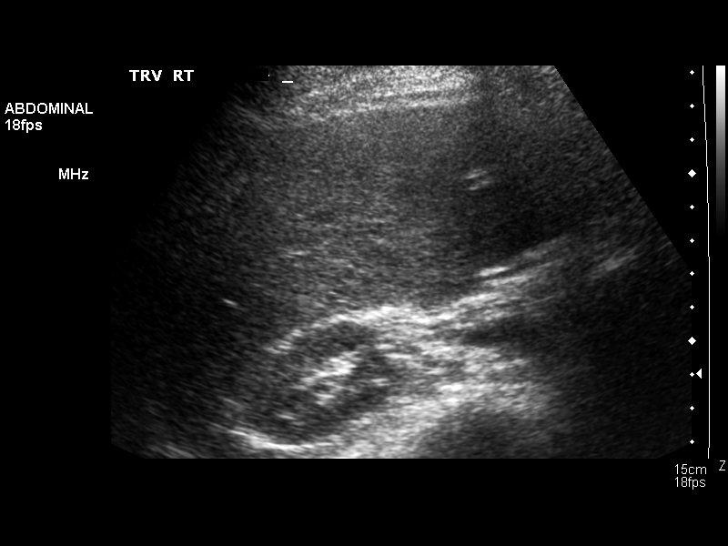
[im 51/81]
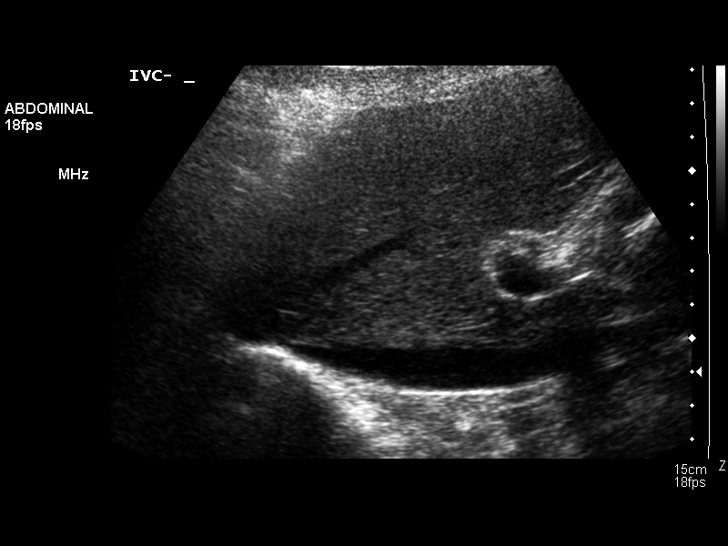
[im 54/81]
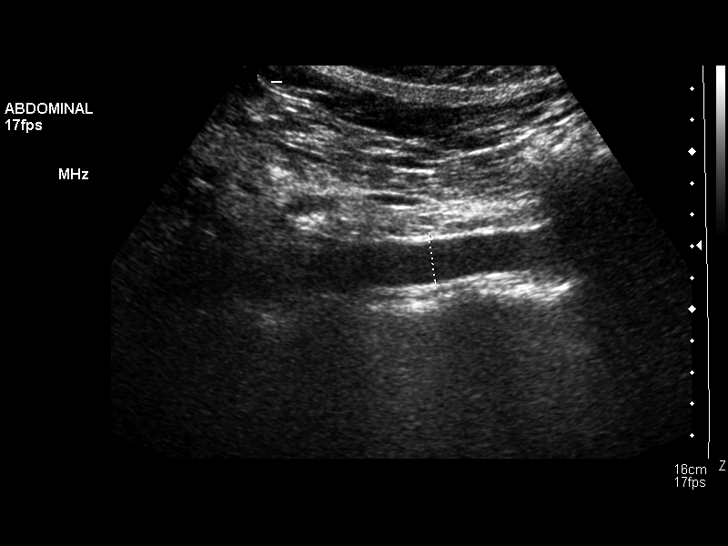
[im 61/81]
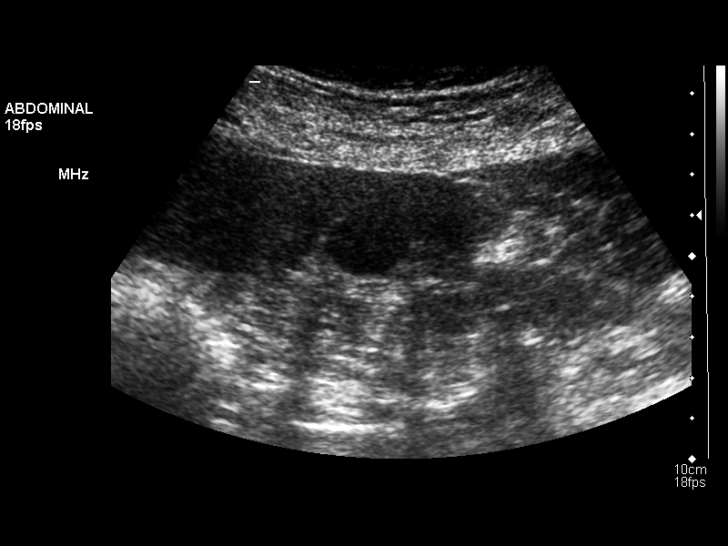
[im 67/81]
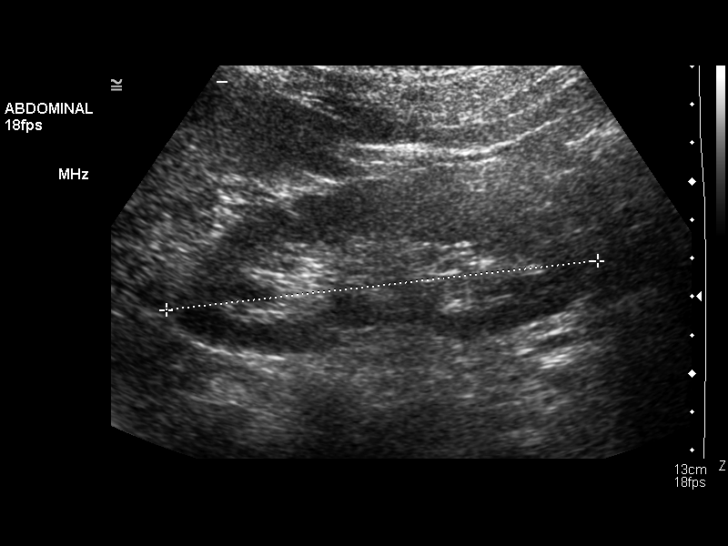
[im 74/81]
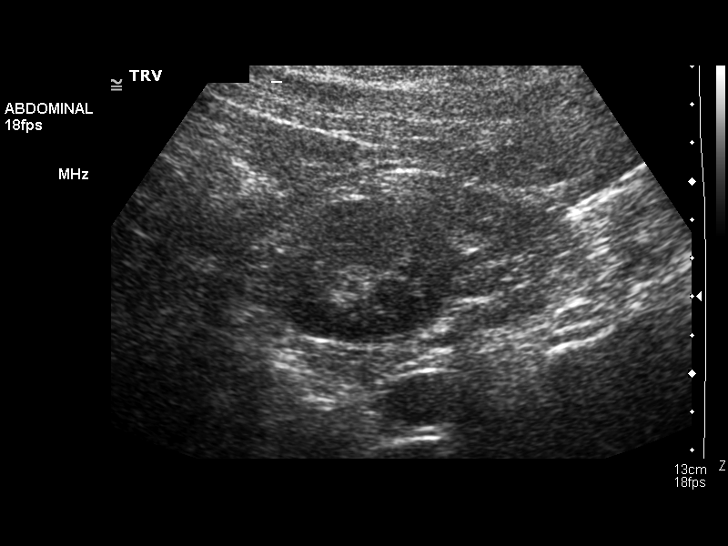
[im 81/81]
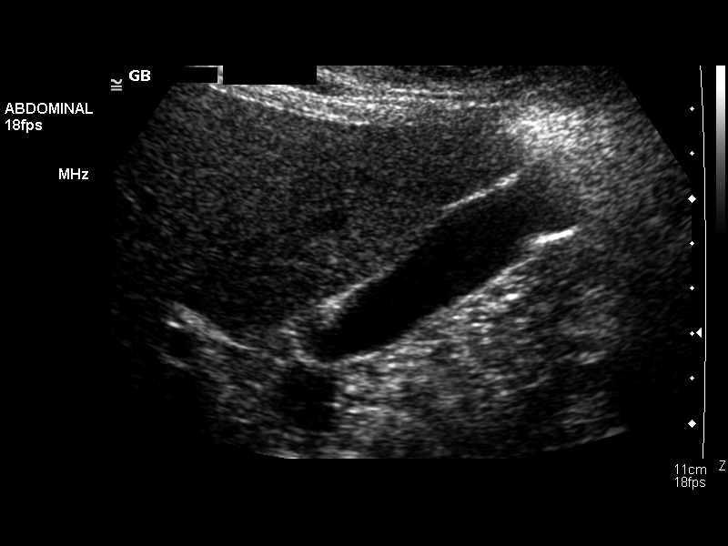

[14 of 25 positions shown; findings below may reference images not displayed]

FINDINGS: Gallbladder:  Sonographically normal without sludge or gallstones
with normal 2 mm wall thickness.

Common bile duct:  No dilated intrahepatic or extrahepatic bile
ducts with common bile duct measuring normally at 3 mm.

Liver:  Borderline increased echogenicity of slight borderline
fatty change.  Liver size is normal with no focal lesions.

IVC:  Sonographically normal.

Pancreas:  Sonographically normal.

Spleen:  Sonographically normal measuring 10.7 cm long.

Right Kidney:  Sonographically normal measuring 11.8 cm long.

Left Kidney:  Sonographically normal measuring 11.3 cm long.

Abdominal aorta:  Sonographically normal with maximum proximal
diameter 2.1 cm.

No free fluid is seen.
IMPRESSION: 1.  Borderline to slight fatty infiltration of the liver.
2.  Otherwise, normal.

## 2010-10-23 ENCOUNTER — Other Ambulatory Visit: Payer: Self-pay | Admitting: Family Medicine

## 2010-10-23 DIAGNOSIS — Z1231 Encounter for screening mammogram for malignant neoplasm of breast: Secondary | ICD-10-CM

## 2010-10-25 ENCOUNTER — Other Ambulatory Visit: Payer: Self-pay | Admitting: *Deleted

## 2010-10-25 MED ORDER — ZOLMITRIPTAN 5 MG PO TABS: 5.0000 mg | ORAL_TABLET | ORAL | Status: DC | PRN

## 2010-10-31 ENCOUNTER — Ambulatory Visit
Admission: RE | Admit: 2010-10-31 | Discharge: 2010-10-31 | Disposition: A | Discharge status: Home / Self Care | Payer: 59 | Source: Ambulatory Visit | Attending: Family Medicine | Admitting: Family Medicine

## 2010-10-31 DIAGNOSIS — Z1231 Encounter for screening mammogram for malignant neoplasm of breast: Secondary | ICD-10-CM

## 2010-11-02 ENCOUNTER — Telehealth: Payer: Self-pay | Admitting: Family Medicine

## 2010-11-02 NOTE — Telephone Encounter (Signed)
Generic Imitrex 100 mg #12

## 2010-11-02 NOTE — Telephone Encounter (Signed)
Pt has been on Zomig and would like something cheaper for her headaches to be called to [facility].

## 2010-11-03 MED ORDER — SUMATRIPTAN SUCCINATE 100 MG PO TABS: ORAL_TABLET | ORAL | Status: DC

## 2010-11-03 NOTE — Telephone Encounter (Signed)
Spoke with pt - informed of med rx'd by Dr. Amador Cunas , added to med list and efilled to [facility]. KIK

## 2010-11-03 NOTE — Telephone Encounter (Signed)
Has his refill been called in

## 2010-11-17 ENCOUNTER — Encounter: Payer: Self-pay | Admitting: Internal Medicine

## 2010-11-17 ENCOUNTER — Ambulatory Visit (INDEPENDENT_AMBULATORY_CARE_PROVIDER_SITE_OTHER): Payer: 59 | Admitting: Internal Medicine

## 2010-11-17 VITALS — BP 120/70 | Temp 98.3°F | Wt 256.0 lb

## 2010-11-17 DIAGNOSIS — R1030 Lower abdominal pain, unspecified: Secondary | ICD-10-CM

## 2010-11-17 DIAGNOSIS — R109 Unspecified abdominal pain: Secondary | ICD-10-CM

## 2010-11-17 DIAGNOSIS — I10 Essential (primary) hypertension: Secondary | ICD-10-CM

## 2010-11-17 DIAGNOSIS — F341 Dysthymic disorder: Secondary | ICD-10-CM

## 2010-11-17 LAB — POCT URINALYSIS DIPSTICK
Bilirubin, UA: NEGATIVE
Ketones, UA: NEGATIVE
Nitrite, UA: NEGATIVE
Urobilinogen, UA: 0.2

## 2010-11-17 MED ORDER — CIPROFLOXACIN HCL 500 MG PO TABS: 500.0000 mg | ORAL_TABLET | Freq: Two times a day (BID) | ORAL | Status: AC

## 2010-11-17 NOTE — Patient Instructions (Signed)
Drink as much fluid as you  can tolerate over the next few days  Call or return to clinic prn if these symptoms worsen or fail to improve as anticipated.  

## 2010-11-17 NOTE — Progress Notes (Signed)
  Subjective:    Patient ID: Maria Boone, female    DOB: 1965-10-06, 45 y.o.   MRN: [id number]  HPI  45 year old patient who has a history of treated hypertension. She also has ADHD and migraine headaches that have been stable. The past few days she has noted some lower bowel pressure and mild discomfort. She is noticing cloudy urine. Denies any fever chills flank pain or dysuria. No real urinary frequency. She has had monilial vaginitis but no prior history of UTI. No recent antibiotic use   Review of Systems  Constitutional: Negative.   HENT: Negative for hearing loss, congestion, sore throat, rhinorrhea, dental problem, sinus pressure and tinnitus.   Eyes: Negative for pain, discharge and visual disturbance.  Respiratory: Negative for cough and shortness of breath.   Cardiovascular: Negative for chest pain, palpitations and leg swelling.  Gastrointestinal: Positive for abdominal pain. Negative for nausea, vomiting, diarrhea, constipation, blood in stool and abdominal distention.  Genitourinary: Negative for dysuria, urgency, frequency, hematuria, flank pain, vaginal bleeding, vaginal discharge, difficulty urinating, vaginal pain and pelvic pain.  Musculoskeletal: Negative for joint swelling, arthralgias and gait problem.  Skin: Negative for rash.  Neurological: Negative for dizziness, syncope, speech difficulty, weakness, numbness and headaches.  Hematological: Negative for adenopathy.  Psychiatric/Behavioral: Negative for behavioral problems, dysphoric mood and agitation. The patient is not nervous/anxious.        Objective:   Physical Exam  Constitutional: She is oriented to person, place, and time. She appears well-developed and well-nourished. No distress.        Obese. Blood pressure 120/70  HENT:  Head: Normocephalic.  Right Ear: External ear normal.  Left Ear: External ear normal.  Mouth/Throat: Oropharynx is clear and moist.  Eyes: Conjunctivae and EOM are normal.  Pupils are equal, round, and reactive to light.  Neck: Normal range of motion. Neck supple. No thyromegaly present.  Cardiovascular: Normal rate, regular rhythm, normal heart sounds and intact distal pulses.   Pulmonary/Chest: Effort normal and breath sounds normal.  Abdominal: Soft. Bowel sounds are normal. She exhibits no mass. There is tenderness.        Mild suprapubic tenderness  Musculoskeletal: Normal range of motion.  Lymphadenopathy:    She has no cervical adenopathy.  Neurological: She is alert and oriented to person, place, and time.  Skin: Skin is warm and dry. No rash noted.  Psychiatric: She has a normal mood and affect. Her behavior is normal.          Assessment & Plan:   UTI. Will treat with Cipro 5 mg twice a day  ADHD stable  Hypertension well controlled   Patient will follow up next month as scheduled

## 2010-11-23 ENCOUNTER — Telehealth: Payer: Self-pay | Admitting: Family Medicine

## 2010-11-23 MED ORDER — CIPROFLOXACIN HCL 500 MG PO TABS: 500.0000 mg | ORAL_TABLET | Freq: Two times a day (BID) | ORAL | Status: AC

## 2010-11-23 NOTE — Telephone Encounter (Signed)
Maria Boone please call not sure what the issue is here?????

## 2010-11-23 NOTE — Telephone Encounter (Signed)
Pt saw dr Kirtland Bouchard on Friday 11-17-2010 for back pain. Pt was dx with uti. [facility] [phone]

## 2010-11-23 NOTE — Telephone Encounter (Signed)
Cipro 500 mg b.i.d., dispensed 20 no refills

## 2010-11-23 NOTE — Telephone Encounter (Signed)
patient  Has taken the ATB and the UTI was almost better, but she is still having low back pain and has frequent urination with no pain.  She is unable to come to the office today because she is at work by herself until 8 pm.  She is willing to come in tomorrow if needed.  Any suggestions?

## 2010-11-23 NOTE — Telephone Encounter (Signed)
rx sent and patient is aware 

## 2010-12-08 ENCOUNTER — Other Ambulatory Visit (INDEPENDENT_AMBULATORY_CARE_PROVIDER_SITE_OTHER): Payer: 59 | Admitting: Family Medicine

## 2010-12-08 DIAGNOSIS — Z0000 Encounter for general adult medical examination without abnormal findings: Secondary | ICD-10-CM

## 2010-12-08 LAB — HEPATIC FUNCTION PANEL
ALT: 15 U/L (ref 0–35)
AST: 18 U/L (ref 0–37)
Albumin: 3.8 g/dL (ref 3.5–5.2)
Alkaline Phosphatase: 89 U/L (ref 39–117)
Bilirubin, Direct: 0.1 mg/dL (ref 0.0–0.3)
Total Bilirubin: 0.6 mg/dL (ref 0.3–1.2)
Total Protein: 6.9 g/dL (ref 6.0–8.3)

## 2010-12-08 LAB — CBC WITH DIFFERENTIAL/PLATELET
Basophils Absolute: 0 10*3/uL (ref 0.0–0.1)
Eosinophils Absolute: 0.1 10*3/uL (ref 0.0–0.7)
Eosinophils Relative: 0.9 % (ref 0.0–5.0)
Lymphocytes Relative: 24 % (ref 12.0–46.0)
Lymphs Abs: 2 10*3/uL (ref 0.7–4.0)
MCHC: 34.5 g/dL (ref 30.0–36.0)
Monocytes Absolute: 0.6 10*3/uL (ref 0.1–1.0)
Monocytes Relative: 7.6 % (ref 3.0–12.0)
Platelets: 260 10*3/uL (ref 150.0–400.0)
RBC: 4.01 Mil/uL (ref 3.87–5.11)
RDW: 13.5 % (ref 11.5–14.6)

## 2010-12-08 LAB — POCT URINALYSIS DIPSTICK
Bilirubin, UA: NEGATIVE
Ketones, UA: NEGATIVE
Leukocytes, UA: NEGATIVE

## 2010-12-08 LAB — BASIC METABOLIC PANEL
Calcium: 9.5 mg/dL (ref 8.4–10.5)
Chloride: 105 mEq/L (ref 96–112)
Glucose, Bld: 91 mg/dL (ref 70–99)
Sodium: 142 mEq/L (ref 135–145)

## 2010-12-08 LAB — LIPID PANEL
HDL: 46.7 mg/dL (ref >39.00)
LDL Cholesterol: 121 mg/dL — ABNORMAL HIGH (ref 0–99)
Total CHOL/HDL Ratio: 4
VLDL: 27.4 mg/dL (ref 0.0–40.0)

## 2010-12-19 ENCOUNTER — Other Ambulatory Visit (HOSPITAL_COMMUNITY)
Admission: RE | Admit: 2010-12-19 | Discharge: 2010-12-19 | Disposition: A | Discharge status: Home / Self Care | Payer: 59 | Source: Ambulatory Visit | Attending: Family Medicine | Admitting: Family Medicine

## 2010-12-19 ENCOUNTER — Encounter: Payer: Self-pay | Admitting: Family Medicine

## 2010-12-19 ENCOUNTER — Ambulatory Visit (INDEPENDENT_AMBULATORY_CARE_PROVIDER_SITE_OTHER): Payer: 59 | Admitting: Family Medicine

## 2010-12-19 DIAGNOSIS — N938 Other specified abnormal uterine and vaginal bleeding: Secondary | ICD-10-CM

## 2010-12-19 DIAGNOSIS — E663 Overweight: Secondary | ICD-10-CM

## 2010-12-19 DIAGNOSIS — Z0000 Encounter for general adult medical examination without abnormal findings: Secondary | ICD-10-CM

## 2010-12-19 DIAGNOSIS — I10 Essential (primary) hypertension: Secondary | ICD-10-CM

## 2010-12-19 DIAGNOSIS — N949 Unspecified condition associated with female genital organs and menstrual cycle: Secondary | ICD-10-CM

## 2010-12-19 DIAGNOSIS — E039 Hypothyroidism, unspecified: Secondary | ICD-10-CM

## 2010-12-19 DIAGNOSIS — Z01419 Encounter for gynecological examination (general) (routine) without abnormal findings: Secondary | ICD-10-CM | POA: Insufficient documentation

## 2010-12-19 MED ORDER — LEVOTHYROXINE SODIUM 125 MCG PO TABS: 125.0000 ug | ORAL_TABLET | Freq: Every day | ORAL | Status: DC

## 2010-12-19 MED ORDER — TRIAMTERENE-HCTZ 37.5-25 MG PO TABS: 1.0000 | ORAL_TABLET | Freq: Every day | ORAL | Status: DC

## 2010-12-19 MED ORDER — SUMATRIPTAN SUCCINATE 100 MG PO TABS: ORAL_TABLET | ORAL | Status: DC

## 2010-12-19 MED ORDER — LISINOPRIL 10 MG PO TABS: 10.0000 mg | ORAL_TABLET | Freq: Every day | ORAL | Status: DC

## 2010-12-19 NOTE — Progress Notes (Signed)
  Subjective:    Patient ID: Maria Boone, female    DOB: July 24, 1966, 45 y.o.   MRN: [id number]  HPIShannon is a 45 year old, married female, nonsmoker, who comes in today for physical examination because of a history of hypothyroidism on Synthroid 125 mcg daily, hypertension, on Zestril, 10 mg daily, BP 120/84, Imitrex p.r.n. For migraines, and Ritalin 10 mg daily for ADD.  She also takes Maxzide 37.5 -- 25 daily.  BP 120/84.  She has stopped the Ritalin.  She does not seek it's helped to focus and concentration.  She began a diet and exercise program and has lost 6 pounds.  The, splinter.  She went for an eye, exam and was diagnosed to have glaucoma.  Her pressures were in the high 20s.  On, eyedrops Lumigan, her pressures are down to low teens.  Her ophthalmologist is Dr. Vonna Kotyk.    Review of Systems  Constitutional: Negative.   HENT: Negative.   Eyes: Negative.   Respiratory: Negative.   Cardiovascular: Negative.   Gastrointestinal: Negative.   Genitourinary: Negative.   Musculoskeletal: Negative.   Neurological: Negative.   Hematological: Negative.   Psychiatric/Behavioral: Negative.        Objective:   Physical Exam  Constitutional: She appears well-developed and well-nourished.  HENT:  Head: Normocephalic and atraumatic.  Right Ear: External ear normal.  Left Ear: External ear normal.  Nose: Nose normal.  Mouth/Throat: Oropharynx is clear and moist.  Eyes: EOM are normal. Pupils are equal, round, and reactive to light.  Neck: Normal range of motion. Neck supple. No thyromegaly present.  Cardiovascular: Normal rate, regular rhythm, normal heart sounds and intact distal pulses.  Exam reveals no gallop and no friction rub.   No murmur heard. Pulmonary/Chest: Effort normal and breath sounds normal.  Abdominal: Soft. Bowel sounds are normal. She exhibits no distension and no mass. There is no tenderness. There is no rebound.  Genitourinary: Vagina normal and uterus  normal. Guaiac negative stool. No vaginal discharge found.       Bilateral breast exam normal  Musculoskeletal: Normal range of motion.  Lymphadenopathy:    She has no cervical adenopathy.  Neurological: She is alert. She has normal reflexes. No cranial nerve deficit. She exhibits normal muscle tone. Coordination normal.  Skin: Skin is warm and dry.  Psychiatric: She has a normal mood and affect. Her behavior is normal. Judgment and thought content normal.          Assessment & Plan:  Hypertension continue Zestril 10 daily.  Hypothyroidism.  Continue Synthroid 125 mcg daily.  History of migraine headaches Imitrex p.r.n.  New diagnosis of glaucoma.  Continue eye drops followed by ophthalmologist.  Obesity.  Continue diet, exercise and weight loss

## 2010-12-19 NOTE — Patient Instructions (Signed)
Continue your current medications.  Continue the diet and exercise program.  Follow-up in one year, sooner if any problems

## 2010-12-20 ENCOUNTER — Telehealth: Payer: Self-pay | Admitting: *Deleted

## 2010-12-20 MED ORDER — LEVOTHYROXINE SODIUM 125 MCG PO TABS: 125.0000 ug | ORAL_TABLET | Freq: Every day | ORAL | Status: DC

## 2010-12-20 NOTE — Telephone Encounter (Signed)
Rx carnifications

## 2010-12-25 ENCOUNTER — Telehealth: Payer: Self-pay | Admitting: Family Medicine

## 2010-12-25 MED ORDER — LEVOTHYROXINE SODIUM 125 MCG PO TABS: 125.0000 ug | ORAL_TABLET | Freq: Every day | ORAL | Status: DC

## 2010-12-25 NOTE — Telephone Encounter (Signed)
Pt went to [facility]  [address] to pick up script for Levothyroxine, and instructions say to take 1 pill two times a day. Pls call pharmacy and clarify instructions asap.

## 2011-01-12 ENCOUNTER — Encounter: Payer: Self-pay | Admitting: Family Medicine

## 2011-01-12 ENCOUNTER — Ambulatory Visit (INDEPENDENT_AMBULATORY_CARE_PROVIDER_SITE_OTHER): Payer: 59 | Admitting: Family Medicine

## 2011-01-12 VITALS — BP 112/62 | Temp 98.3°F | Wt 258.0 lb

## 2011-01-12 DIAGNOSIS — N390 Urinary tract infection, site not specified: Secondary | ICD-10-CM

## 2011-01-12 LAB — POCT URINALYSIS DIPSTICK
Nitrite, UA: NEGATIVE
Protein, UA: NEGATIVE
Urobilinogen, UA: 0.2
pH, UA: 5.5

## 2011-01-12 MED ORDER — NITROFURANTOIN MONOHYD MACRO 100 MG PO CAPS: 100.0000 mg | ORAL_CAPSULE | Freq: Two times a day (BID) | ORAL | Status: AC

## 2011-01-12 NOTE — Progress Notes (Signed)
  Subjective:    Patient ID: Maria Boone, female    DOB: 1966-03-11, 45 y.o.   MRN: [id number]  HPI Here for 3 days of urinary pressure and burning. No nausea or fever. She has reduced her use of sodas and now drinks a lot more water. She was treated here for a UTI in March with Cipro. No culture was done at that time, but her symptoms went away.    Review of Systems  Constitutional: Negative.   Gastrointestinal: Negative.   Genitourinary: Positive for dysuria, urgency and frequency. Negative for flank pain.       Objective:   Physical Exam  Abdominal: Soft. Bowel sounds are normal. There is no tenderness. There is no rebound and no guarding.          Assessment & Plan:  She has another UTI. Will culture this sample

## 2011-01-12 NOTE — Assessment & Plan Note (Signed)
Maria Boone OFFICE NOTE   Maria Boone                  MRN:          [id number]  DATE:10/02/2006                            DOB:          [birth year]    NEW PATIENT EVALUATION:  Maria Boone is a 45 year old married white female  who comes in today for evaluation of multiple issues.   PAST MEDICAL HISTORY:  1. Arthritis.  2. Childbirth x2.   OUTPATIENT SURGERY:  None.   ILLNESSES:  None.   INJURIES:  None.   ALLERGIES:  __________ gives her a mouth rash.  She has no systemic  symptoms.   She does not smoke or drink any alcohol.   MEDICATIONS:  Levoxyl 0.15 mg daily.   Last physical was a year ago.  She has concerns about her thyroid,  concerns about a fast heart rate, headache, aching in her hips.   REVIEW OF SYSTEMS:  She does have migraine headaches, occur about one  every 3 months.  She has not been taking medications.  We discussed  treatment options.  EYES, EARS, NOSE AND THROAT:  Negative.  She gets  regular dental care and regular eye screenings for glaucoma.  CARDIOPULMONARY:  Negative.  GI:  Negative.  GU:  Negative.  GYN:  Gravida 2, para 2, AB 0.  LMP January 17-22.  Menses are normal.  For  birth control her husband has had a vasectomy.  She does BSE on an  irregular basis.  Her last mammogram was 5 years ago.  ENDOCRINE:  Thyroid:  She has hypothyroidism, controlled with Levoxyl 0.15 mg daily.  MUSCULOSKELETAL:  Negative.  VASCULAR:  Negative.  ALLERGY:  Negative.   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  She does not exercise on a regular basis.  Married, 2 children.   FAMILY HISTORY:  Dad 31, chronic renal failure, hypertension, lymphoma.  Mother 35, has early dementia, type unknown at this juncture.  They  think it may be Alzheimer's.  She is having short-term memory loss.  Evaluation is in progress.  No brothers, no  sisters.  Last tetanus 1996.   PHYSICAL EVALUATION:  VITAL SIGNS:  Height is 5 feet 4 inches, weight  was 249.  Ideal body weight for her height would be around 160.  BP  130/95, pulse 70 and regular, and she is afebrile.  GENERAL:  She is a well-developed, well-nourished, obese white female in  no acute distress.  HEENT:  Negative.  NECK:  Supple.  Thyroid not enlarged.  No carotid bruits.  CHEST:  Clear to auscultation.  CARDIAC:  Negative.  BREASTS:  Negative.  ABDOMEN:  Negative.  PELVIC:  External genitalia within normal limits.  Vaginal vault was  normal.  Cervix was visualized.  Pap smear was done.  Bimanual exam  normal.  RECTAL:  Normal.  Stool guaiac negative.  EXTREMITIES:  Normal.  SKIN:  Normal.  VASCULAR:  Peripheral pulses normal.   LABORATORY DATA:  Pending.  Will be done today.   IMPRESSION:  1.  Hypothyroidism.  Continue Synthroid 0.15 mg daily.  2. History of migraine headaches.  Start Zomig-ZMT 5 mg p.r.n.  3. Obese.  We counseled her on diet, exercise and weight loss, to lose      1 pound per week over the next 12 months.  4. Status post childbirth x2.  5. Question hypertension.  Patient to do blood pressure monitoring at      home.  If blood pressure is above 135/85, then she is to return for      recheck.   Patient given adult DT to catch her up on her vaccinations, advised to  get a mammogram.     Tinnie Gens A. Tawanna Cooler, MD  Electronically Signed    JAT/MedQ  DD: 10/02/2006  DT: 10/02/2006  Job #: [id number]

## 2011-01-17 ENCOUNTER — Encounter: Payer: Self-pay | Admitting: Family Medicine

## 2011-01-17 ENCOUNTER — Ambulatory Visit (INDEPENDENT_AMBULATORY_CARE_PROVIDER_SITE_OTHER): Payer: 59 | Admitting: Family Medicine

## 2011-01-17 ENCOUNTER — Telehealth: Payer: Self-pay | Admitting: *Deleted

## 2011-01-17 VITALS — BP 122/82 | HR 91 | Temp 98.2°F | Wt 255.0 lb

## 2011-01-17 DIAGNOSIS — Z888 Allergy status to other drugs, medicaments and biological substances status: Secondary | ICD-10-CM

## 2011-01-17 DIAGNOSIS — N390 Urinary tract infection, site not specified: Secondary | ICD-10-CM

## 2011-01-17 DIAGNOSIS — T7840XA Allergy, unspecified, initial encounter: Secondary | ICD-10-CM

## 2011-01-17 NOTE — Telephone Encounter (Signed)
Left message for pt to call back for appt

## 2011-01-17 NOTE — Telephone Encounter (Signed)
Pt returned call and has been sch for an ov with Dr Clent Ridges for today, as noted.

## 2011-01-17 NOTE — Telephone Encounter (Signed)
See me today 

## 2011-01-17 NOTE — Telephone Encounter (Signed)
Pt has broken out in a rash on both arms with raised bumps and redness with itching. Is taking Macrobid.

## 2011-01-17 NOTE — Progress Notes (Signed)
  Subjective:    Patient ID: Maria Boone, female    DOB: 06/30/1966, 45 y.o.   MRN: [id number]  HPI She was seen here on 01-12-11 for a UTI and placed on Macrobid. She has now taken 6 days of this, and her urinary symptoms have resolved. However 2 days ago she developed an itchy rash on both arms. Benadryl helps the itching. No other problems. No SOB or wheezing.    Review of Systems  Constitutional: Negative.   Skin: Positive for rash.       Objective:   Physical Exam  Constitutional: She appears well-developed and well-nourished.  Neck: Normal range of motion. Neck supple. No thyromegaly present.  Cardiovascular: Normal rate, regular rhythm, normal heart sounds and intact distal pulses.   Pulmonary/Chest: Effort normal and breath sounds normal.  Lymphadenopathy:    She has no cervical adenopathy.  Skin:       Both arms have a widespread maculopapular erythematous rash           Assessment & Plan:  This is an allergic reaction, so she will avoid Macrobid in the future. Use Benadryl prn. Stop the Macrobid, since the UTI has been taken care of.

## 2011-01-24 ENCOUNTER — Other Ambulatory Visit (INDEPENDENT_AMBULATORY_CARE_PROVIDER_SITE_OTHER): Payer: 59

## 2011-01-24 DIAGNOSIS — E039 Hypothyroidism, unspecified: Secondary | ICD-10-CM

## 2011-01-24 LAB — TSH: TSH: 22.41 u[IU]/mL — ABNORMAL HIGH (ref 0.35–5.50)

## 2011-01-26 ENCOUNTER — Telehealth: Payer: Self-pay | Admitting: *Deleted

## 2011-01-26 MED ORDER — LEVOTHYROXINE SODIUM 150 MCG PO TABS: 150.0000 ug | ORAL_TABLET | Freq: Every day | ORAL | Status: DC

## 2011-01-26 NOTE — Telephone Encounter (Signed)
Message copied by Trenton Gammon on Fri Jan 26, 2011  1:47 PM ------      Message from: TODD, JEFFREY A      Created: Thu Jan 25, 2011  6:47 PM       Increase the Synthroid to 150 mcg daily.......... Follow-up TSH in 6 weeks,,,,,,,, dispense a hundred tabs refill x 3

## 2011-01-26 NOTE — Telephone Encounter (Signed)
patient  Is aware of lab results and rx sent

## 2011-01-29 ENCOUNTER — Ambulatory Visit: Payer: 59 | Admitting: Family Medicine

## 2011-01-30 ENCOUNTER — Ambulatory Visit: Payer: 59 | Admitting: Family Medicine

## 2011-03-01 ENCOUNTER — Other Ambulatory Visit (INDEPENDENT_AMBULATORY_CARE_PROVIDER_SITE_OTHER): Payer: 59

## 2011-03-01 DIAGNOSIS — E039 Hypothyroidism, unspecified: Secondary | ICD-10-CM

## 2011-03-01 LAB — TSH: TSH: 0.86 u[IU]/mL (ref 0.35–5.50)

## 2011-03-02 NOTE — Progress Notes (Signed)
patient  Is aware 

## 2011-07-05 ENCOUNTER — Ambulatory Visit: Payer: 59 | Admitting: Family Medicine

## 2011-07-09 ENCOUNTER — Ambulatory Visit (INDEPENDENT_AMBULATORY_CARE_PROVIDER_SITE_OTHER): Payer: 59 | Admitting: Family Medicine

## 2011-07-09 ENCOUNTER — Encounter: Payer: Self-pay | Admitting: Family Medicine

## 2011-07-09 DIAGNOSIS — R490 Dysphonia: Secondary | ICD-10-CM

## 2011-07-09 NOTE — Progress Notes (Signed)
  Subjective:    Patient ID: Jacky Kindle, female    DOB: 03/03/1966, 45 y.o.   MRN: [id number]  HPI   Kadra is a 45 year old, married female, nonsmoker, who comes in today for evaluation of voice loss.  She states about 3 weeks ago she noticed at the end of the day.  She would lose her voice.  She went to urgent care a week ago today, and they gave her a shot of steroids, and 5 days of oral prednisone, diagnosis unknown.  She has no fever, earache, sore throat, cough, nausea, vomiting, or diarrhea.  Review of Systems    General ENT review of systems otherwise negative Objective:   Physical Exam  Well-developed overweight, female, in no acute distress.  HEENT negative.  Neck was supple.  No adenopathy.  Lungs are clear.  Cardiac exam normal      Assessment & Plan:  Voice loss, unknown etiology.  Plan ENT consult

## 2011-07-09 NOTE — Patient Instructions (Signed)
Call Dr. Dillard Cannon, ENT, and make an appointment to have your vocal cords checked

## 2011-07-17 ENCOUNTER — Ambulatory Visit: Payer: 59 | Admitting: Family Medicine

## 2011-08-25 ENCOUNTER — Telehealth: Payer: Self-pay | Admitting: *Deleted

## 2011-08-25 MED ORDER — DICLOFENAC SODIUM 75 MG PO TBEC: 75.0000 mg | DELAYED_RELEASE_TABLET | Freq: Two times a day (BID) | ORAL | Status: DC

## 2011-08-25 NOTE — Telephone Encounter (Signed)
Caller request anti-inflammatory until Pt can be seen by PCP. Per Vo Dr Linford Arnold, Diclofenac 75 mg tab [1] BID Pt informed.

## 2011-09-14 ENCOUNTER — Ambulatory Visit (INDEPENDENT_AMBULATORY_CARE_PROVIDER_SITE_OTHER): Payer: 59 | Admitting: Internal Medicine

## 2011-09-14 ENCOUNTER — Encounter: Payer: Self-pay | Admitting: Internal Medicine

## 2011-09-14 ENCOUNTER — Ambulatory Visit: Payer: 59 | Admitting: Family

## 2011-09-14 VITALS — BP 140/80 | Temp 98.3°F | Wt 264.0 lb

## 2011-09-14 DIAGNOSIS — J029 Acute pharyngitis, unspecified: Secondary | ICD-10-CM

## 2011-09-14 DIAGNOSIS — I10 Essential (primary) hypertension: Secondary | ICD-10-CM

## 2011-09-14 MED ORDER — DICLOFENAC SODIUM 75 MG PO TBEC: 75.0000 mg | DELAYED_RELEASE_TABLET | Freq: Two times a day (BID) | ORAL | Status: DC

## 2011-09-14 MED ORDER — ZOLPIDEM TARTRATE 10 MG PO TABS: 10.0000 mg | ORAL_TABLET | Freq: Every evening | ORAL | Status: DC | PRN

## 2011-09-14 NOTE — Progress Notes (Signed)
  Subjective:    Patient ID: Maria Boone, female    DOB: 10-14-1965, 46 y.o.   MRN: [id number]  HPI  46 year old patient who presents with a two-day history of sore throat and some left ear discomfort. She is concerned about a possible ear infection. She has been under considerable situational stress due to the poor health of her mother who is hospitalized with advanced dementia pneumonia and not expected to survive. She has been using Voltaren for inflammation of the right hand. There has been no fever. She has treated hypertension.    Review of Systems  Constitutional: Negative.  Negative for fever and chills.  HENT: Positive for ear pain and sore throat. Negative for hearing loss, congestion, rhinorrhea, dental problem, sinus pressure and tinnitus.   Eyes: Negative for pain, discharge and visual disturbance.  Respiratory: Negative for cough and shortness of breath.   Cardiovascular: Negative for chest pain, palpitations and leg swelling.  Gastrointestinal: Negative for nausea, vomiting, abdominal pain, diarrhea, constipation, blood in stool and abdominal distention.  Genitourinary: Negative for dysuria, urgency, frequency, hematuria, flank pain, vaginal bleeding, vaginal discharge, difficulty urinating, vaginal pain and pelvic pain.  Musculoskeletal: Negative for joint swelling, arthralgias and gait problem.  Skin: Negative for rash.  Neurological: Negative for dizziness, syncope, speech difficulty, weakness, numbness and headaches.  Hematological: Negative for adenopathy.  Psychiatric/Behavioral: Negative for behavioral problems, dysphoric mood and agitation. The patient is not nervous/anxious.        Objective:   Physical Exam  Constitutional: She is oriented to person, place, and time. She appears well-developed and well-nourished.       Repeat blood pressure unchanged at 140/80  HENT:  Head: Normocephalic.  Right Ear: External ear normal.  Left Ear: External ear normal.         Minimal erythema of the oropharynx Both tympanic membranes and canals were normal  Eyes: Conjunctivae and EOM are normal. Pupils are equal, round, and reactive to light.  Neck: Normal range of motion. Neck supple. No thyromegaly present.  Cardiovascular: Normal rate, regular rhythm, normal heart sounds and intact distal pulses.   Pulmonary/Chest: Effort normal and breath sounds normal.  Abdominal: Soft. Bowel sounds are normal. She exhibits no mass. There is no tenderness.  Musculoskeletal: Normal range of motion.  Lymphadenopathy:    She has no cervical adenopathy.  Neurological: She is alert and oriented to person, place, and time.  Skin: Skin is warm and dry. No rash noted.  Psychiatric: She has a normal mood and affect. Her behavior is normal.          Assessment & Plan:   Mild pharyngitis with otalgia. We'll continue Voltaren twice daily. Will add Tylenol if needed Situational stress. Was given a prescription for Ambien to use short-term Hypertension stable

## 2011-09-14 NOTE — Patient Instructions (Signed)
Call or return to clinic prn if these symptoms worsen or fail to improve as anticipated.

## 2011-09-25 ENCOUNTER — Ambulatory Visit (INDEPENDENT_AMBULATORY_CARE_PROVIDER_SITE_OTHER): Payer: 59 | Admitting: Family Medicine

## 2011-09-25 ENCOUNTER — Encounter: Payer: Self-pay | Admitting: Family Medicine

## 2011-09-25 DIAGNOSIS — M79609 Pain in unspecified limb: Secondary | ICD-10-CM

## 2011-09-25 DIAGNOSIS — F341 Dysthymic disorder: Secondary | ICD-10-CM

## 2011-09-25 DIAGNOSIS — M79641 Pain in right hand: Secondary | ICD-10-CM | POA: Insufficient documentation

## 2011-09-25 MED ORDER — AMITRIPTYLINE HCL 25 MG PO TABS: 25.0000 mg | ORAL_TABLET | Freq: Every day | ORAL | Status: DC

## 2011-09-25 NOTE — Patient Instructions (Signed)
Do not take the Ambien  Elavil 25 mg daily at bedtime  If you feel like you need grief counseling then I would recommend Judithe Modest who works with [place]  Motrin 600 mg twice daily with food, elevation and ice 15 minutes at bedtime or as often as possible  Where the short arm splint at bed  time  If after 4-6 weeks the soreness will not resolve then I would recommend a consult with Dr. Merlyn Lot

## 2011-09-25 NOTE — Progress Notes (Signed)
  Subjective:    Patient ID: Maria Boone, female    DOB: 09/30/1965, 46 y.o.   MRN: [id number]  HPI Maria Boone is a 46 year old female who comes in today for evaluation of 3 problems  She states that her mother died recently and she's having symptoms of anxiety and sleep dysfunction. She was given a prescription for the 10 mg Ambien tablets however when she read the package insert she declined to take it  She would like to discuss some alternatives  She's got a history of pain in her right hand for the past 2 months. The urgent care called her in full tear and 75 mg twice a day without seeing her!!!!!!!!!!!!!!!!. She has no history of trauma. The soreness is in her right middle finger when she hyperextends it and the lateral portion from the left fifth finger to the wrist. No history of trauma. She does work all day on a Animator for [employer]   Review of Systems    general psychiatric musculoskeletal review of systems otherwise negative Objective:   Physical Exam Well-developed well-nourished female in acute distress examination the hand shows that he and appears to be normal normal range of motion no bony tenderness no swelling no edema pulses normal. There is some mild tenderness with hyperextension of the middle finger       Assessment & Plan:  Tendinitis right hand plan splint at bedtime Motrin 600 twice a day elevation and ice without consult with Dr. Merlyn Lot when necessary  Sleep dysfunction and Elavil 25 each bedtime okay to work declined to fill out FMLA for this problem

## 2011-10-10 ENCOUNTER — Ambulatory Visit: Payer: 59 | Admitting: Internal Medicine

## 2011-10-10 ENCOUNTER — Ambulatory Visit (INDEPENDENT_AMBULATORY_CARE_PROVIDER_SITE_OTHER): Payer: 59 | Admitting: Family Medicine

## 2011-10-10 ENCOUNTER — Encounter: Payer: Self-pay | Admitting: Family Medicine

## 2011-10-10 DIAGNOSIS — I10 Essential (primary) hypertension: Secondary | ICD-10-CM

## 2011-10-10 DIAGNOSIS — E039 Hypothyroidism, unspecified: Secondary | ICD-10-CM

## 2011-10-10 DIAGNOSIS — J069 Acute upper respiratory infection, unspecified: Secondary | ICD-10-CM

## 2011-10-10 DIAGNOSIS — E669 Obesity, unspecified: Secondary | ICD-10-CM

## 2011-10-10 LAB — CBC WITH DIFFERENTIAL/PLATELET
Basophils Absolute: 0 10*3/uL (ref 0.0–0.1)
Eosinophils Absolute: 0.1 10*3/uL (ref 0.0–0.7)
Eosinophils Relative: 1.7 % (ref 0.0–5.0)
Lymphs Abs: 2.1 10*3/uL (ref 0.7–4.0)
MCV: 96.7 fl (ref 78.0–100.0)
Monocytes Absolute: 0.7 10*3/uL (ref 0.1–1.0)
Neutrophils Relative %: 65.3 % (ref 43.0–77.0)
Platelets: 252 10*3/uL (ref 150.0–400.0)
RDW: 13.6 % (ref 11.5–14.6)
WBC: 8.5 10*3/uL (ref 4.5–10.5)

## 2011-10-10 LAB — BASIC METABOLIC PANEL
BUN: 25 mg/dL — ABNORMAL HIGH (ref 6–23)
Calcium: 9.6 mg/dL (ref 8.4–10.5)
Creatinine, Ser: 1.4 mg/dL — ABNORMAL HIGH (ref 0.4–1.2)
GFR: 44.2 mL/min — ABNORMAL LOW (ref >60.00)

## 2011-10-10 LAB — POCT URINALYSIS DIPSTICK
Ketones, UA: NEGATIVE
Leukocytes, UA: NEGATIVE
Nitrite, UA: NEGATIVE
Protein, UA: NEGATIVE
Urobilinogen, UA: 0.2
pH, UA: 5.5

## 2011-10-10 LAB — LDL CHOLESTEROL, DIRECT: Direct LDL: 174.7 mg/dL

## 2011-10-10 LAB — LIPID PANEL
Cholesterol: 278 mg/dL — ABNORMAL HIGH (ref 0–200)
Triglycerides: 261 mg/dL — ABNORMAL HIGH (ref 0.0–149.0)

## 2011-10-10 LAB — HEPATIC FUNCTION PANEL
ALT: 22 U/L (ref 0–35)
AST: 21 U/L (ref 0–37)
Alkaline Phosphatase: 102 U/L (ref 39–117)
Total Bilirubin: 0.7 mg/dL (ref 0.3–1.2)

## 2011-10-10 LAB — TSH: TSH: 22.91 u[IU]/mL — ABNORMAL HIGH (ref 0.35–5.50)

## 2011-10-10 MED ORDER — HYDROCODONE-HOMATROPINE 5-1.5 MG/5ML PO SYRP: ORAL_SOLUTION | ORAL | Status: DC

## 2011-10-10 NOTE — Progress Notes (Signed)
  Subjective:    Patient ID: Maria Boone, female    DOB: April 22, 1966, 46 y.o.   MRN: [id number]  HPI Maria Boone is a 46 year old married female who comes in today for evaluation of fatigue left ear ache sore throat and cough for 5 days  Review of systems otherwise negative  Medications reviewed no changes she's due for her physical exam   Review of Systems  general and ENT and cardiopulmonary view of systems otherwise negative except she's dealing with the aftermath of mild depression from the death of her mother this past winter    Objective:   Physical Exam Well-developed well-nourished female in no acute distress HEENT negative neck was supple no adenopathy lungs are clear       Assessment & Plan:  Viral syndrome plan treat symptomatically with liquids and Hydromet cough syrup when necessary  Physical labs

## 2011-10-10 NOTE — Patient Instructions (Signed)
Tylenol as needed  Chloraseptic lozenges for sore throat  Hydromet 1/2-1 teaspoon at bedtime when necessary for cough and cold

## 2011-10-11 LAB — HEMOGLOBIN A1C: Hgb A1c MFr Bld: 5.2 % (ref 4.6–6.5)

## 2011-10-16 ENCOUNTER — Other Ambulatory Visit: Payer: 59

## 2011-10-23 ENCOUNTER — Ambulatory Visit (INDEPENDENT_AMBULATORY_CARE_PROVIDER_SITE_OTHER): Payer: 59 | Admitting: Family Medicine

## 2011-10-23 ENCOUNTER — Encounter: Payer: Self-pay | Admitting: Family Medicine

## 2011-10-23 ENCOUNTER — Other Ambulatory Visit (HOSPITAL_COMMUNITY)
Admission: RE | Admit: 2011-10-23 | Discharge: 2011-10-23 | Disposition: A | Discharge status: Home / Self Care | Payer: 59 | Source: Ambulatory Visit | Attending: Family Medicine | Admitting: Family Medicine

## 2011-10-23 DIAGNOSIS — N949 Unspecified condition associated with female genital organs and menstrual cycle: Secondary | ICD-10-CM

## 2011-10-23 DIAGNOSIS — F341 Dysthymic disorder: Secondary | ICD-10-CM

## 2011-10-23 DIAGNOSIS — I10 Essential (primary) hypertension: Secondary | ICD-10-CM

## 2011-10-23 DIAGNOSIS — N938 Other specified abnormal uterine and vaginal bleeding: Secondary | ICD-10-CM

## 2011-10-23 DIAGNOSIS — N939 Abnormal uterine and vaginal bleeding, unspecified: Secondary | ICD-10-CM

## 2011-10-23 DIAGNOSIS — E663 Overweight: Secondary | ICD-10-CM

## 2011-10-23 DIAGNOSIS — Z01419 Encounter for gynecological examination (general) (routine) without abnormal findings: Secondary | ICD-10-CM | POA: Insufficient documentation

## 2011-10-23 DIAGNOSIS — E039 Hypothyroidism, unspecified: Secondary | ICD-10-CM

## 2011-10-23 DIAGNOSIS — N926 Irregular menstruation, unspecified: Secondary | ICD-10-CM

## 2011-10-23 DIAGNOSIS — Z0000 Encounter for general adult medical examination without abnormal findings: Secondary | ICD-10-CM

## 2011-10-23 MED ORDER — SYNTHROID 200 MCG PO TABS: 200.0000 ug | ORAL_TABLET | Freq: Every day | ORAL | Status: DC

## 2011-10-23 MED ORDER — TRIAMTERENE-HCTZ 37.5-25 MG PO TABS: 1.0000 | ORAL_TABLET | Freq: Every day | ORAL | Status: DC

## 2011-10-23 MED ORDER — AMITRIPTYLINE HCL 25 MG PO TABS: 25.0000 mg | ORAL_TABLET | Freq: Every day | ORAL | Status: DC

## 2011-10-23 MED ORDER — LISINOPRIL 10 MG PO TABS: 10.0000 mg | ORAL_TABLET | Freq: Every day | ORAL | Status: DC

## 2011-10-23 MED ORDER — SUMATRIPTAN SUCCINATE 100 MG PO TABS: ORAL_TABLET | ORAL | Status: DC

## 2011-10-23 NOTE — Patient Instructions (Signed)
We will set you up for a consult to begin the data next is size and weight loss program  Increase the Synthroid to 200 mcg daily  Decrease the lisinopril to one half tablet daily  Followup here in 6 weeks

## 2011-10-23 NOTE — Progress Notes (Signed)
  Subjective:    Patient ID: Maria Boone, female    DOB: 1965/09/15, 46 y.o.   MRN: [id number]  HPI Laketta is a delightful 46 year old married female G2 P2 nonsmoker who comes in today for general physical examination  She takes any Elavil  25 mg at bedtime for sleep dysfunction and to prevent migraine headaches  She takes Motrin 600 mg twice a day for osteoarthritis  Her weight is up to 262 pounds,,,,,,,,, she is willing to join a weight loss program  She takes Synthroid 150 mcg daily for hypo-thyroidism  She also takes lisinopril 10 mg daily for hypertension BP 108/74. She takes Imitrex  when necessary for migraines however the migraines have been quiet.  Review of Systems  Constitutional: Negative.   HENT: Negative.   Eyes: Negative.   Respiratory: Negative.   Cardiovascular: Negative.   Gastrointestinal: Negative.   Genitourinary: Negative.   Musculoskeletal: Negative.   Neurological: Negative.   Hematological: Negative.   Psychiatric/Behavioral: Negative.    for birth control her husband's vasectomy     Objective:   Physical Exam  Constitutional: She appears well-developed and well-nourished.  HENT:  Head: Normocephalic and atraumatic.  Right Ear: External ear normal.  Left Ear: External ear normal.  Nose: Nose normal.  Mouth/Throat: Oropharynx is clear and moist.  Eyes: EOM are normal. Pupils are equal, round, and reactive to light.  Neck: Normal range of motion. Neck supple. No thyromegaly present.  Cardiovascular: Normal rate, regular rhythm, normal heart sounds and intact distal pulses.  Exam reveals no gallop and no friction rub.   No murmur heard. Pulmonary/Chest: Effort normal and breath sounds normal.  Abdominal: Soft. Bowel sounds are normal. She exhibits no distension and no mass. There is no tenderness. There is no rebound.  Genitourinary: Vagina normal and uterus normal. Guaiac negative stool. No vaginal discharge found.       Bilateral breast  exam normal  Musculoskeletal: Normal range of motion.  Lymphadenopathy:    She has no cervical adenopathy.  Neurological: She is alert. She has normal reflexes. No cranial nerve deficit. She exhibits normal muscle tone. Coordination normal.  Skin: Skin is warm and dry.  Psychiatric: She has a normal mood and affect. Her behavior is normal. Judgment and thought content normal.          Assessment & Plan:  Obesity I will refer her to the [facility] for weight loss  Hypertension continue current medications except decrease lisinopril to 5 mg daily  Osteoarthritis Motrin 600 twice a day  Hypothyroidism TSH is 12 increase Synthroid to 200 mcg daily followup in 6 weeks. Migraine headaches currently asymptomatic Imitrex when necessary

## 2011-11-10 IMAGING — US US TRANSVAGINAL NON-OB
1 series · 13 of 25 positions shown · non-contrast
Comparison: None.

CLINICAL DATA: Dysfunctional uterine bleeding.  LMP 05/21/2010



[Series 1: us transvaginal non-ob · 0.29mm/px · 13 of 58 slices shown]
[im 1/58]
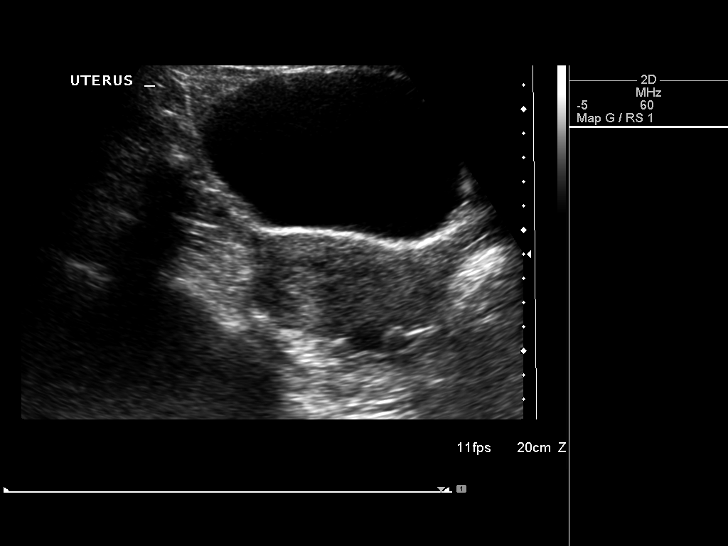
[im 5/58]
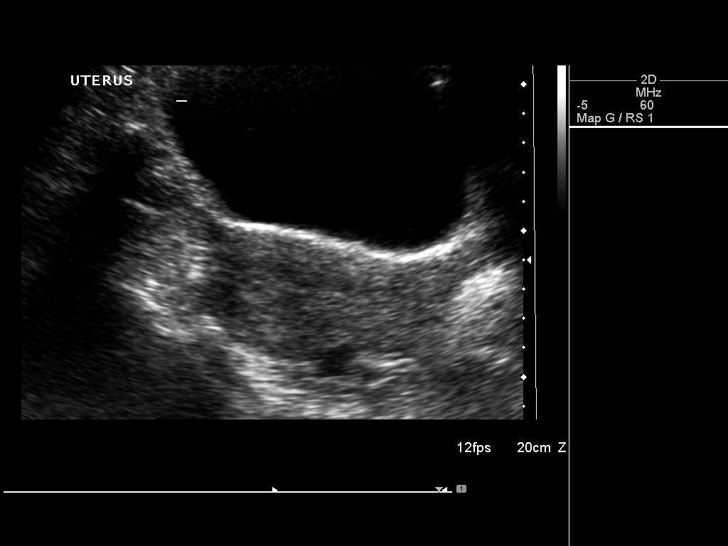
[im 10/58]
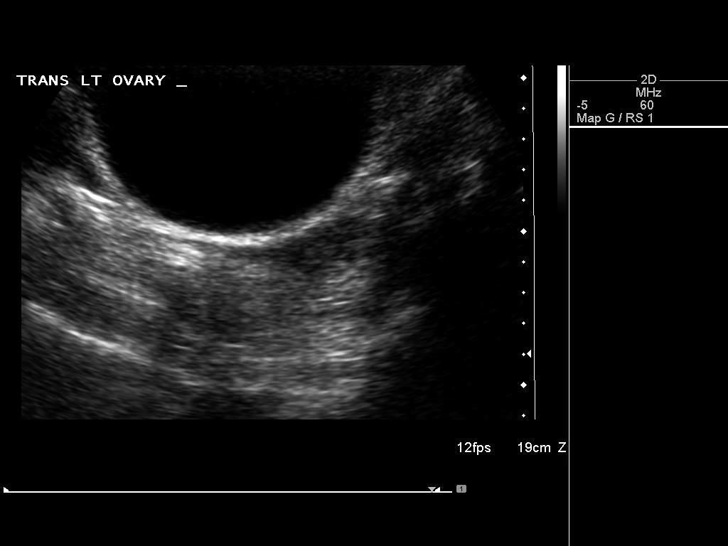
[im 15/58]
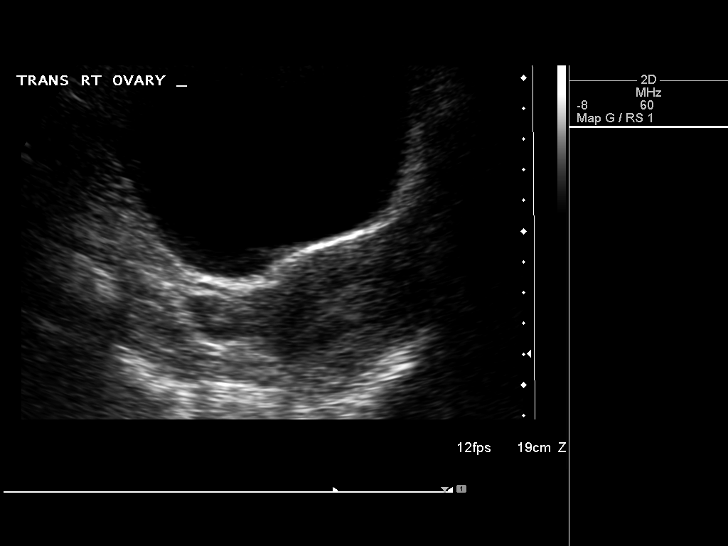
[im 20/58]
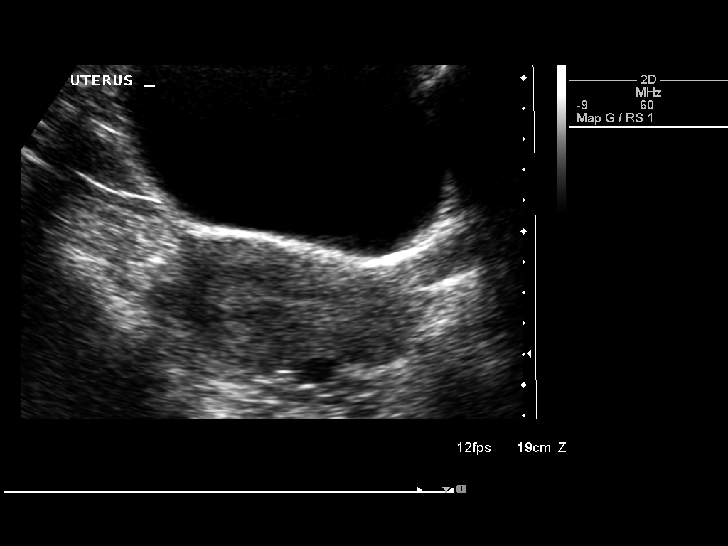
[im 24/58]
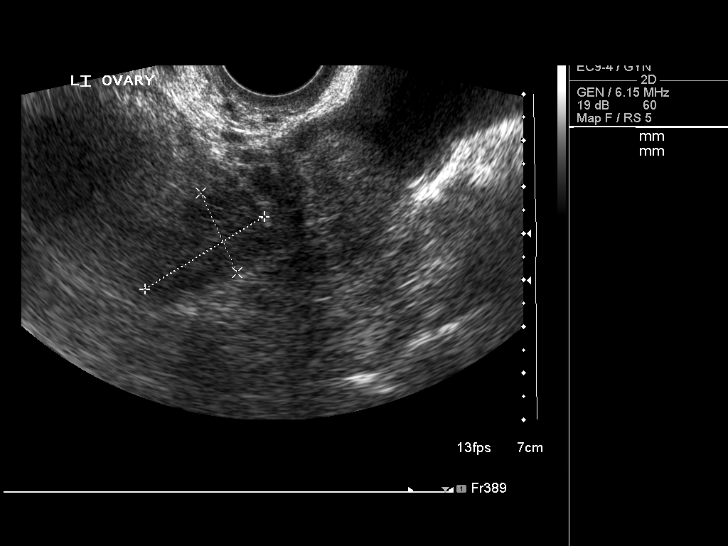
[im 29/58]
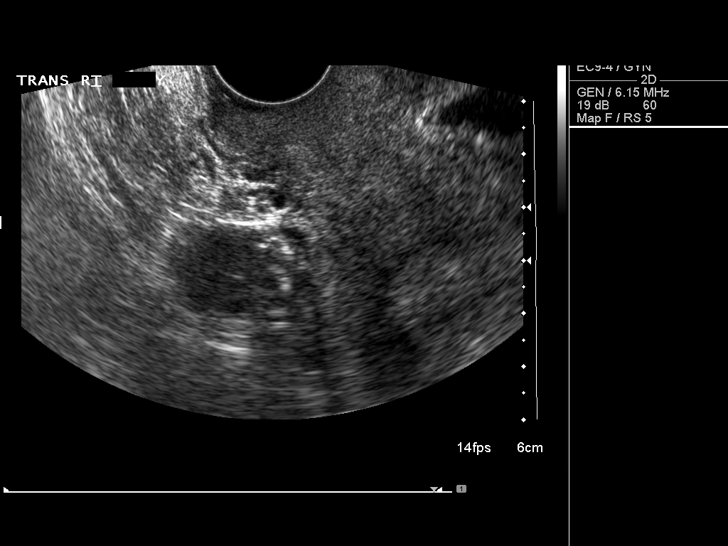
[im 34/58]
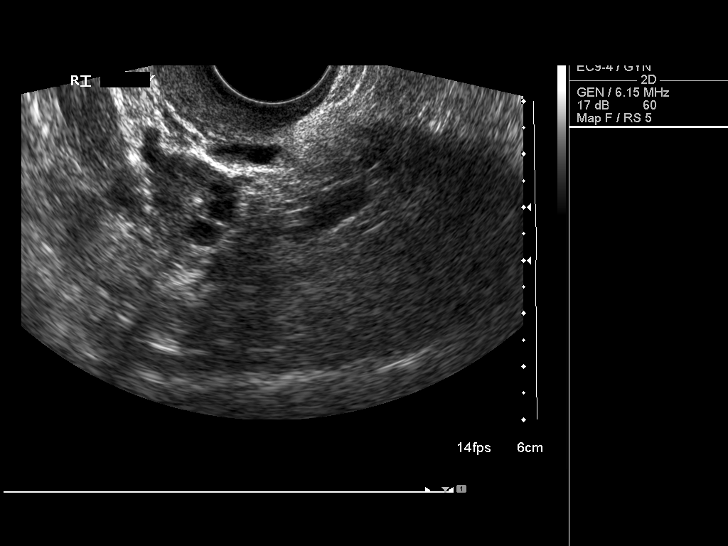
[im 39/58]
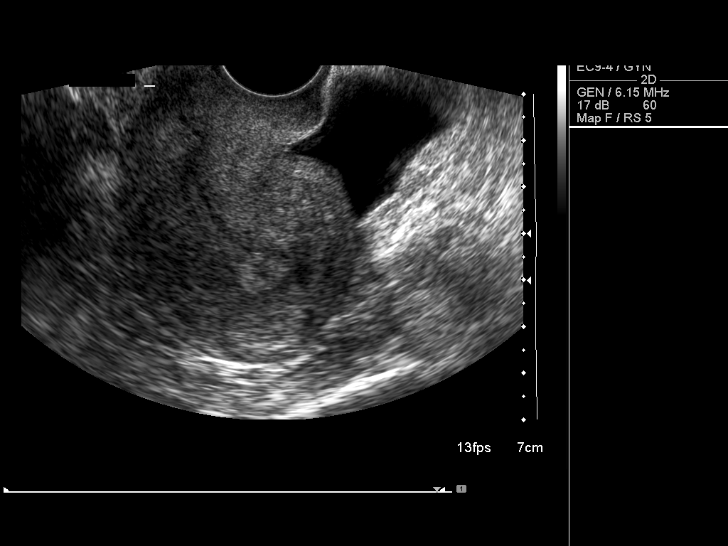
[im 43/58]
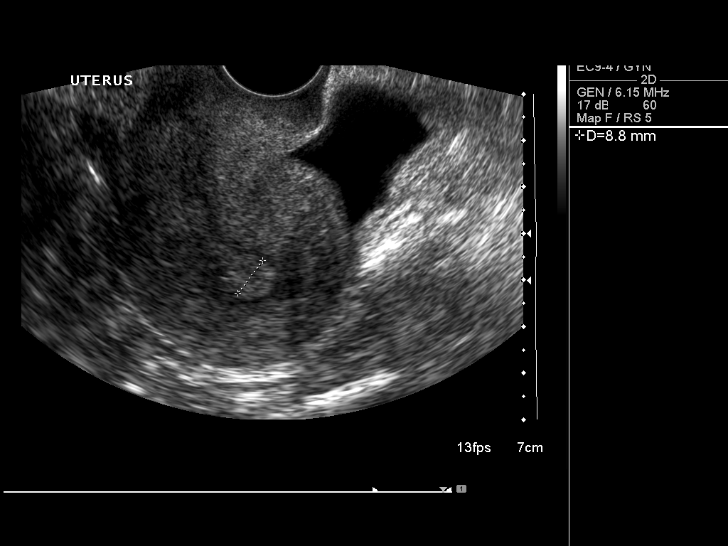
[im 48/58]
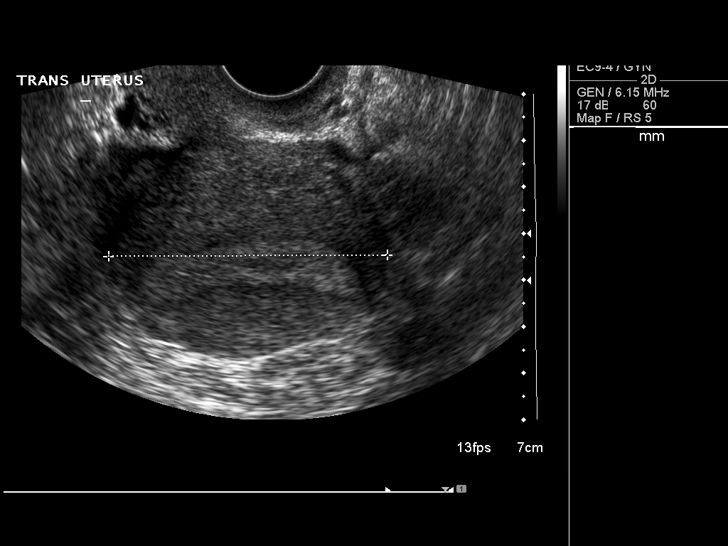
[im 53/58]
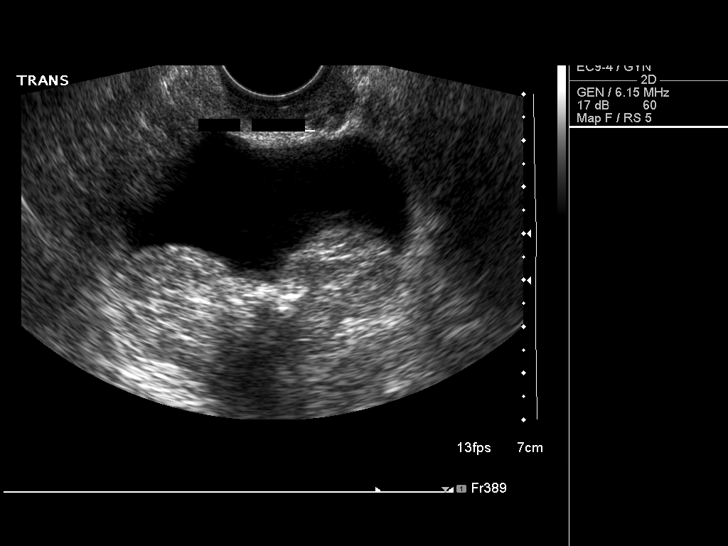
[im 58/58]
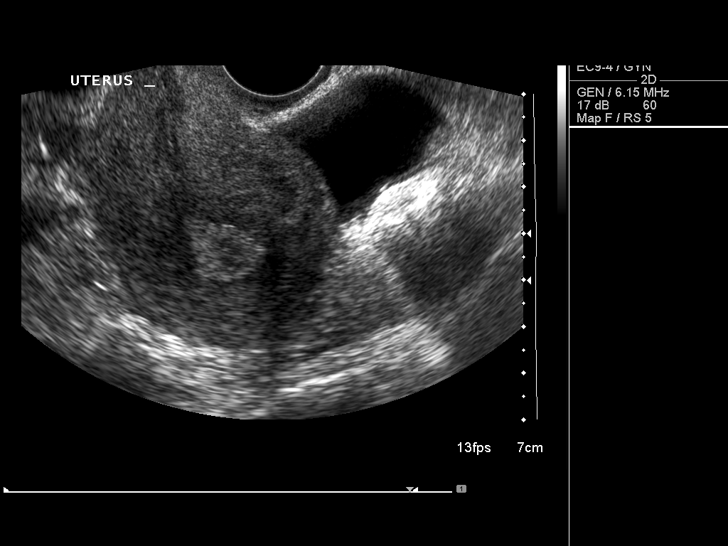

[13 of 25 positions shown; findings below may reference images not displayed]

FINDINGS: Uterus the uterus is retroflexed with a sagittal length of 7.2 cm,
an AP depth of 4.8 cm and a transverse width of 6.0 cm.  A
homogeneous uterine myometrium is seen.

Endometrium is tri- layered with an AP depth of 8.8 mm.  No areas
of focal thickening or heterogeneity are seen and this would
correlate with a periovulatory endometrial stripe and correspond
with the given LMP of 05/21/2010.

Right Ovary has a normal appearance measuring 2.0 x 1.7 x 1.8 cm

Left Ovary has a normal appearance measuring 3.0 x 1.9 x 2.7 cm

Other Findings:  A small amount of simple free fluid is noted in
the cul-de-sac.  No separate adnexal masses are seen.
IMPRESSION: Normal uterine myometrium, endometrium and ovaries.

## 2011-11-12 ENCOUNTER — Other Ambulatory Visit: Payer: Self-pay | Admitting: Family Medicine

## 2011-11-12 DIAGNOSIS — Z1231 Encounter for screening mammogram for malignant neoplasm of breast: Secondary | ICD-10-CM

## 2011-11-26 ENCOUNTER — Ambulatory Visit
Admission: RE | Admit: 2011-11-26 | Discharge: 2011-11-26 | Disposition: A | Discharge status: Home / Self Care | Payer: 59 | Source: Ambulatory Visit | Attending: Family Medicine | Admitting: Family Medicine

## 2011-11-26 DIAGNOSIS — Z1231 Encounter for screening mammogram for malignant neoplasm of breast: Secondary | ICD-10-CM

## 2011-12-06 ENCOUNTER — Ambulatory Visit: Payer: 59 | Admitting: Family Medicine

## 2012-06-27 ENCOUNTER — Observation Stay (HOSPITAL_COMMUNITY)
Admission: EM | Admit: 2012-06-27 | Discharge: 2012-06-28 | Disposition: A | Discharge status: Home / Self Care | Payer: 59 | Attending: Emergency Medicine | Admitting: Emergency Medicine

## 2012-06-27 ENCOUNTER — Encounter (HOSPITAL_COMMUNITY): Payer: Self-pay | Admitting: *Deleted

## 2012-06-27 DIAGNOSIS — E079 Disorder of thyroid, unspecified: Secondary | ICD-10-CM | POA: Insufficient documentation

## 2012-06-27 DIAGNOSIS — Z79899 Other long term (current) drug therapy: Secondary | ICD-10-CM | POA: Insufficient documentation

## 2012-06-27 DIAGNOSIS — M79609 Pain in unspecified limb: Principal | ICD-10-CM | POA: Insufficient documentation

## 2012-06-27 DIAGNOSIS — G43909 Migraine, unspecified, not intractable, without status migrainosus: Secondary | ICD-10-CM | POA: Insufficient documentation

## 2012-06-27 DIAGNOSIS — E669 Obesity, unspecified: Secondary | ICD-10-CM | POA: Insufficient documentation

## 2012-06-27 DIAGNOSIS — M79604 Pain in right leg: Secondary | ICD-10-CM

## 2012-06-27 DIAGNOSIS — I10 Essential (primary) hypertension: Secondary | ICD-10-CM | POA: Insufficient documentation

## 2012-06-27 LAB — CBC WITH DIFFERENTIAL/PLATELET
Basophils Relative: 0 % (ref 0–1)
Eosinophils Absolute: 0.1 10*3/uL (ref 0.0–0.7)
Eosinophils Relative: 2 % (ref 0–5)
HCT: 37.6 % (ref 36.0–46.0)
Hemoglobin: 12.8 g/dL (ref 12.0–15.0)
Lymphs Abs: 2.9 10*3/uL (ref 0.7–4.0)
MCH: 32.2 pg (ref 26.0–34.0)
MCHC: 34 g/dL (ref 30.0–36.0)
MCV: 94.5 fL (ref 78.0–100.0)
Monocytes Absolute: 0.6 10*3/uL (ref 0.1–1.0)
Monocytes Relative: 6 % (ref 3–12)
Neutrophils Relative %: 60 % (ref 43–77)
RBC: 3.98 MIL/uL (ref 3.87–5.11)

## 2012-06-27 LAB — PROTIME-INR: INR: 0.94 (ref 0.00–1.49)

## 2012-06-27 MED ORDER — ACETAMINOPHEN 325 MG PO TABS: 650.0000 mg | ORAL_TABLET | ORAL | Status: DC | PRN

## 2012-06-27 MED ORDER — OXYCODONE-ACETAMINOPHEN 5-325 MG PO TABS
2.0000 | ORAL_TABLET | Freq: Four times a day (QID) | ORAL | Status: DC | PRN
  Administered 2012-06-28: 2 via ORAL
  Filled 2012-06-27: qty 2

## 2012-06-27 MED ORDER — ENOXAPARIN SODIUM 150 MG/ML ~~LOC~~ SOLN
1.0000 mg/kg | Freq: Two times a day (BID) | SUBCUTANEOUS | Status: DC
  Filled 2012-06-27: qty 1

## 2012-06-27 NOTE — ED Notes (Signed)
Pharmacy tech at bedside 

## 2012-06-27 NOTE — ED Notes (Signed)
The pt has had rt lower leg pain with swelling since Monday.  She also has rt knee pain.

## 2012-06-27 NOTE — ED Notes (Signed)
Dr Miller at bedside. 

## 2012-06-27 NOTE — ED Notes (Signed)
Patient complaining of right knee pain and calf swelling that started several days ago.  Patient currently rates pain 5/10 on the numerical pain scale; describes pain as "heavy" and "tighness".  Patient reports that she was seen last week for knee pain; was diagnosed with arthritis.  Patient has appointment for [facility] in about two weeks; they recommended patient come to the ED for a doppler (to rule out blood clots).  Patient alert and oriented x4; PERRL present.  Will continue to monitor.

## 2012-06-27 NOTE — ED Provider Notes (Signed)
History     CSN: [id number]  Arrival date & time 06/27/12  2040   First MD Initiated Contact with Patient 06/27/12 2259      Chief Complaint  Patient presents with  . Leg Pain    (Consider location/radiation/quality/duration/timing/severity/associated sxs/prior treatment) HPI Comments: The patient is a 46 year old female with a history of thyroid dysfunction who presents with right lower leg swelling which has been present for approximately 4 days. She has had some knee pain for a couple of weeks and this evening went to an orthopedic office at urgent care, was referred to the emergency department for ultrasound of the leg to rule out deep venous thrombosis. 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 She denies any coughing or shortness of breath, no rashes swelling fevers chills nausea vomiting. The symptoms are persistent, gradually worsening and she states that when she measured her calves at home several nights ago her right leg was 2 inches bigger than the left leg in circumference.  Patient is a 46 y.o. female presenting with leg pain. The history is provided by the patient.  Leg Pain     Past Medical History  Diagnosis Date  . Migraine   . Obese   . Hypertension   . Thyroid disease     Past Surgical History  Procedure Date  . Childbirth     x2    Family History  Problem Relation Age of Onset  . Hypertension Other   . Kidney disease Other   . Lymphoma Other     History  Substance Use Topics  . Smoking status: Never Smoker   . Smokeless tobacco: Never Used  . Alcohol Use: No    OB History    Grav Para Term Preterm Abortions TAB SAB Ect Mult Living                  Review of Systems  All other systems reviewed and are negative.    Allergies  Amoxicillin and Nitrofurantoin monohyd macro  Home Medications   Current Outpatient Rx  Name Route Sig Dispense Refill  .  AMITRIPTYLINE HCL 25 MG PO TABS Oral Take 1 tablet (25 mg total) by mouth at bedtime. 100 tablet 3  . BIMATOPROST 0.01 % OP SOLN Ophthalmic Apply 1 drop to eye at bedtime.     Marland Kitchen VITAMIN D 1000 UNITS PO TABS Oral Take 1,000 Units by mouth daily.    Marland Kitchen DICLOFENAC SODIUM 75 MG PO TBEC Oral Take 75 mg by mouth 3 (three) times daily.    Marland Kitchen OLMESARTAN MEDOXOMIL 5 MG PO TABS Oral Take 10 mg by mouth daily.    . SUMATRIPTAN SUCCINATE 100 MG PO TABS  1 po at onset of migraine , may repeat in 2hours . Not to exceed 2 pills in 24hrs. 6 tablet 3  . SYNTHROID 200 MCG PO TABS Oral Take 1 tablet (200 mcg total) by mouth daily. 100 tablet 3    Dispense as written.  Marland Kitchen VITAMIN B-12 1000 MCG PO TABS Oral Take 1,000 mcg by mouth daily.      BP 103/53  Pulse 67  Temp 98.5 F (36.9 C) (Oral)  Resp 18  SpO2 100%  Physical Exam  Nursing note and vitals reviewed. Constitutional: She appears well-developed and well-nourished. No distress.  HENT:  Head: Normocephalic and atraumatic.  Mouth/Throat: Oropharynx is clear and moist. No oropharyngeal exudate.  Eyes:  Conjunctivae normal and EOM are normal. Pupils are equal, round, and reactive to light. Right eye exhibits no discharge. Left eye exhibits no discharge. No scleral icterus.  Neck: Normal range of motion. Neck supple. No JVD present. No thyromegaly present.  Cardiovascular: Normal rate, regular rhythm, normal heart sounds and intact distal pulses.  Exam reveals no gallop and no friction rub.   No murmur heard.      Normal pedal pulses bilaterally  Pulmonary/Chest: Effort normal and breath sounds normal. No respiratory distress. She has no wheezes. She has no rales.  Abdominal: Soft. Bowel sounds are normal. She exhibits no distension and no mass. There is no tenderness.  Musculoskeletal: Normal range of motion. She exhibits tenderness ( Tenderness to palpation over the right calf, asymmetry of the lower extremities right greater than left.). She exhibits no  edema.  Lymphadenopathy:    She has no cervical adenopathy.  Neurological: She is alert. Coordination normal.  Skin: Skin is warm and dry. No rash noted. No erythema.  Psychiatric: She has a normal mood and affect. Her behavior is normal.    ED Course  Procedures (including critical care time)   Labs Reviewed  CBC WITH DIFFERENTIAL  PROTIME-INR  APTT  CBC  BASIC METABOLIC PANEL   No results found.   No diagnosis found.    MDM  The patient has a normal exam other than her right lower extremity, there is some concern for deep venous thrombosis, ultrasound ordered for the morning, Lovenox at this time, labs pending, will admit to observation unit overnight. The patient has followup at the orthopedic office on Monday.  [facility] =- Dr. Santiago Bumpers.  [place]ahnge of shift - care signed out to [facility] PA-C and oncoming physician      Vida Roller, MD 06/28/12 [phone]

## 2012-06-28 DIAGNOSIS — M7989 Other specified soft tissue disorders: Secondary | ICD-10-CM

## 2012-06-28 LAB — BASIC METABOLIC PANEL
BUN: 17 mg/dL (ref 6–23)
CO2: 23 mEq/L (ref 19–32)
Calcium: 9.3 mg/dL (ref 8.4–10.5)
Glucose, Bld: 101 mg/dL — ABNORMAL HIGH (ref 70–99)
Sodium: 141 mEq/L (ref 135–145)

## 2012-06-28 LAB — CBC
HCT: 37.6 % (ref 36.0–46.0)
Hemoglobin: 12.7 g/dL (ref 12.0–15.0)
MCH: 31.7 pg (ref 26.0–34.0)
MCHC: 33.8 g/dL (ref 30.0–36.0)
RBC: 4.01 MIL/uL (ref 3.87–5.11)

## 2012-06-28 MED ORDER — TRAMADOL HCL 50 MG PO TABS: 50.0000 mg | ORAL_TABLET | Freq: Four times a day (QID) | ORAL | Status: DC | PRN

## 2012-06-28 MED ORDER — DIPHENHYDRAMINE HCL 50 MG/ML IJ SOLN
25.0000 mg | Freq: Once | INTRAMUSCULAR | Status: AC
  Administered 2012-06-28: 25 mg via INTRAVENOUS
  Filled 2012-06-28: qty 1

## 2012-06-28 MED ORDER — ENOXAPARIN SODIUM 120 MG/0.8ML ~~LOC~~ SOLN
1.0000 mg/kg | Freq: Two times a day (BID) | SUBCUTANEOUS | Status: DC
  Administered 2012-06-28: 105 mg via SUBCUTANEOUS
  Filled 2012-06-28 (×3): qty 0.8

## 2012-06-28 MED ORDER — ONDANSETRON HCL 4 MG/2ML IJ SOLN
4.0000 mg | Freq: Once | INTRAMUSCULAR | Status: AC
  Administered 2012-06-28: 4 mg via INTRAVENOUS
  Filled 2012-06-28: qty 2

## 2012-06-28 NOTE — Progress Notes (Signed)
VASCULAR LAB PRELIMINARY  PRELIMINARY  PRELIMINARY  PRELIMINARY  Right lower extremity venous Doppler completed.    Preliminary report:  There is no DVT or SVT noted in the right lower extremity.  Maria Boone, 06/28/2012, 9:07 AM

## 2012-06-28 NOTE — ED Provider Notes (Signed)
KURSTYN NORTHRIP is a 46 y.o. female in CDU from pod A. Signout from Dr. Hyacinth Meeker as follows: Patient has right lower distress but he swelling for several days after having a sore knee for a few days. It is a calf asymmetry greater on the right than the left. She is pending venous duplex to rule out DVT.  Patient seen and examined at the bedside she is in no acute distress. Denies any pain at this time. Also denies shortness of breath chest pain, cough. Lung sounds are clear to auscultation bilaterally, heart is regular rate and rhythm with no murmurs rubs or gallops. Abdominal exam is benign with no tenderness to palpation. Mild calf asymmetry with right greater than left. There is no pitting edema, superficial collaterals, palpable cords or tenderness to palpation  Verbal report from ultrasound tech is that there are no thrombi. Vital signs are stable and patient is ready for discharge. Pt verbalized understanding and agrees with care plan. Outpatient follow-up and return precautions given.    New Prescriptions   TRAMADOL (ULTRAM) 50 MG TABLET    Take 1 tablet (50 mg total) by mouth every 6 (six) hours as needed for pain.    Wynetta Emery, PA-C 06/28/12 0913  Wynetta Emery, PA-C 06/28/12 1031

## 2012-06-28 NOTE — ED Notes (Signed)
CDU full at this time; charge RN aware that we cannot move patient to CDU.  Patient currently resting comfortably in bed; no respiratory or acute distress noted.  Patient updated on plan of care; will continue to monitor.

## 2012-06-28 NOTE — Discharge Instructions (Signed)
Your ultrasound today shows that you have no clots in her legs. Please follow with your primary care physician for management of right lower extremity pain.  For pain control you may take:  800mg  of ibuprofen (that is usually 4 over the counter pills)  3 times a day (take with food) andacetaminophen 975mg  (this is 3 over the counter pills) four times a day. Do not drink alcohol or combine with other medications that have acetaminophen as an ingredient (Read the labels!).  For breakthrough pain you may take tramadol. Do not drink alcohol drive or operate heavy machinery when taking tramadol.

## 2012-06-28 NOTE — ED Notes (Signed)
Patient currently resting quietly in bed; no respiratory or acute distress noted.  Patient updated on plan of care; informed patient that she will be transferred to CDU as soon as they have a bed open.  Patient denies any needs at this time; will continue to monitor.

## 2012-06-30 NOTE — ED Provider Notes (Signed)
Pt of Dr Hyacinth Meeker  Suzi Roots, MD 06/30/12 [phone]

## 2012-10-11 ENCOUNTER — Other Ambulatory Visit: Payer: Self-pay

## 2012-10-14 ENCOUNTER — Other Ambulatory Visit: Payer: Self-pay | Admitting: *Deleted

## 2012-10-14 DIAGNOSIS — E039 Hypothyroidism, unspecified: Secondary | ICD-10-CM

## 2012-10-14 MED ORDER — SYNTHROID 200 MCG PO TABS: 200.0000 ug | ORAL_TABLET | Freq: Every day | ORAL | Status: DC

## 2012-11-07 ENCOUNTER — Other Ambulatory Visit: Payer: Self-pay | Admitting: *Deleted

## 2012-11-07 DIAGNOSIS — E039 Hypothyroidism, unspecified: Secondary | ICD-10-CM

## 2012-11-07 MED ORDER — SYNTHROID 200 MCG PO TABS: 200.0000 ug | ORAL_TABLET | Freq: Every day | ORAL | Status: DC

## 2012-11-24 ENCOUNTER — Other Ambulatory Visit: Payer: Self-pay

## 2012-11-24 DIAGNOSIS — Z1231 Encounter for screening mammogram for malignant neoplasm of breast: Secondary | ICD-10-CM

## 2012-12-10 ENCOUNTER — Ambulatory Visit
Admission: RE | Admit: 2012-12-10 | Discharge: 2012-12-10 | Disposition: A | Discharge status: Home / Self Care | Payer: 59 | Source: Ambulatory Visit

## 2012-12-10 DIAGNOSIS — Z1231 Encounter for screening mammogram for malignant neoplasm of breast: Secondary | ICD-10-CM

## 2013-07-02 ENCOUNTER — Other Ambulatory Visit: Payer: Self-pay

## 2013-10-08 ENCOUNTER — Other Ambulatory Visit (HOSPITAL_COMMUNITY): Payer: Self-pay | Admitting: Endocrinology

## 2013-10-08 ENCOUNTER — Ambulatory Visit (HOSPITAL_COMMUNITY)
Admission: RE | Admit: 2013-10-08 | Discharge: 2013-10-08 | Disposition: A | Discharge status: Home / Self Care | Payer: 59 | Source: Ambulatory Visit | Attending: Vascular Surgery | Admitting: Vascular Surgery

## 2013-10-08 DIAGNOSIS — R609 Edema, unspecified: Secondary | ICD-10-CM | POA: Insufficient documentation

## 2013-10-08 DIAGNOSIS — R600 Localized edema: Secondary | ICD-10-CM

## 2013-11-09 ENCOUNTER — Other Ambulatory Visit: Payer: Self-pay

## 2013-11-09 DIAGNOSIS — Z1231 Encounter for screening mammogram for malignant neoplasm of breast: Secondary | ICD-10-CM

## 2013-11-26 ENCOUNTER — Ambulatory Visit
Admission: RE | Admit: 2013-11-26 | Discharge: 2013-11-26 | Disposition: A | Discharge status: Home / Self Care | Payer: 59 | Source: Ambulatory Visit

## 2013-11-26 DIAGNOSIS — Z1231 Encounter for screening mammogram for malignant neoplasm of breast: Secondary | ICD-10-CM

## 2014-08-30 ENCOUNTER — Encounter: Payer: Self-pay | Admitting: Family Medicine

## 2014-08-30 ENCOUNTER — Ambulatory Visit (INDEPENDENT_AMBULATORY_CARE_PROVIDER_SITE_OTHER): Payer: 59 | Admitting: Family Medicine

## 2014-08-30 VITALS — BP 120/80

## 2014-08-30 DIAGNOSIS — I10 Essential (primary) hypertension: Secondary | ICD-10-CM

## 2014-08-30 DIAGNOSIS — M1711 Unilateral primary osteoarthritis, right knee: Secondary | ICD-10-CM | POA: Insufficient documentation

## 2014-08-30 DIAGNOSIS — M179 Osteoarthritis of knee, unspecified: Secondary | ICD-10-CM

## 2014-08-30 MED ORDER — LISINOPRIL 10 MG PO TABS: 10.0000 mg | ORAL_TABLET | Freq: Every day | ORAL | Status: DC

## 2014-08-30 NOTE — Patient Instructions (Signed)
Stop the Benicar  Lisinopril 10 mg,,,,,,,,, 1 daily in the morning  Return when necessary

## 2014-08-30 NOTE — Progress Notes (Signed)
   Subjective:    Patient ID: Jacky Kindle, female    DOB: Jul 12, 1966, 49 y.o.   MRN: [id number]  HPI Lasya is a 49 year old married female nonsmoker who comes in today to get a referral to have her right total knee replacement done tomorrow. She's had this scheduled for many months. Her orthopedist is Dr. Vernell Morgans in [facility]. She's tells me she changed insurance with her [place] [employer] and now requires a referral. This is a waste of time and money for her to have to come in here and do this. I will call the corporate medical Dir. This needs to be changed immediately  She's had pain in her right knee for many years. She played a lot of sports when she was a kid. She's also slightly overweight. Also degenerative joint disease runs in her family. No history of any specific trauma. Not Workmen's Comp.   Review of Systems Review of systems otherwise negative    Objective:   Physical Exam  Slightly overweight female no acute distress examination right knee shows markets swelling. She tells me the workup shows bone-on-bone and she is scheduled for total right knee replacement tomorrow. She would also like to switch from another antihypertensive. Dr. Evlyn Kanner gave her Benicar      Assessment & Plan:  Degenerative joint disease right knee,,,,,,,,,,, total knee replacement tomorrow  Hypertension,,,,,,,,, switch to lisinopril

## 2014-08-30 NOTE — Progress Notes (Signed)
Pre visit review using our clinic review tool, if applicable. No additional management support is needed unless otherwise documented below in the visit note. 

## 2014-08-31 ENCOUNTER — Telehealth: Payer: Self-pay | Admitting: Family Medicine

## 2014-08-31 HISTORY — PX: REPLACEMENT TOTAL KNEE: SUR1224

## 2014-08-31 NOTE — Telephone Encounter (Signed)
emmi emailed °

## 2014-09-24 ENCOUNTER — Telehealth: Payer: Self-pay | Admitting: Endocrinology

## 2014-09-24 MED ORDER — LEVOTHYROXINE SODIUM 175 MCG PO TABS: 175.0000 ug | ORAL_TABLET | Freq: Every day | ORAL | Status: DC

## 2014-09-24 NOTE — Telephone Encounter (Signed)
Pt would like md to resume refill her levothyxine 175 mcg #90 with refills sent to [facility]. Pt does not want dr Evlyn Kanner to refill med

## 2014-09-24 NOTE — Telephone Encounter (Signed)
Okay per Dr Todd 

## 2014-10-27 ENCOUNTER — Other Ambulatory Visit: Payer: Self-pay

## 2014-10-27 DIAGNOSIS — Z1231 Encounter for screening mammogram for malignant neoplasm of breast: Secondary | ICD-10-CM

## 2014-11-08 ENCOUNTER — Ambulatory Visit
Admission: RE | Admit: 2014-11-08 | Discharge: 2014-11-08 | Disposition: A | Discharge status: Home / Self Care | Payer: 59 | Source: Ambulatory Visit

## 2014-11-08 DIAGNOSIS — Z1231 Encounter for screening mammogram for malignant neoplasm of breast: Secondary | ICD-10-CM

## 2014-12-20 ENCOUNTER — Telehealth: Payer: Self-pay | Admitting: Family Medicine

## 2014-12-20 DIAGNOSIS — I10 Essential (primary) hypertension: Secondary | ICD-10-CM

## 2014-12-20 DIAGNOSIS — M7989 Other specified soft tissue disorders: Secondary | ICD-10-CM

## 2014-12-20 DIAGNOSIS — E039 Hypothyroidism, unspecified: Secondary | ICD-10-CM

## 2014-12-20 NOTE — Telephone Encounter (Signed)
Patient would like to go to [facility] to have CPX labs.  She would also like to know if she can have uloric acid lab drawn.  Patient has swelling in her knuckle and wonders if it is gout.  She would like a callback.

## 2014-12-22 NOTE — Telephone Encounter (Signed)
Spoke with patient and labs ordered. 

## 2014-12-31 ENCOUNTER — Other Ambulatory Visit (INDEPENDENT_AMBULATORY_CARE_PROVIDER_SITE_OTHER): Payer: 59

## 2014-12-31 DIAGNOSIS — I10 Essential (primary) hypertension: Secondary | ICD-10-CM | POA: Diagnosis not present

## 2014-12-31 DIAGNOSIS — E039 Hypothyroidism, unspecified: Secondary | ICD-10-CM | POA: Diagnosis not present

## 2014-12-31 DIAGNOSIS — M7989 Other specified soft tissue disorders: Secondary | ICD-10-CM

## 2014-12-31 LAB — BASIC METABOLIC PANEL
BUN: 12 mg/dL (ref 6–23)
CALCIUM: 9.4 mg/dL (ref 8.4–10.5)
CO2: 26 mEq/L (ref 19–32)
Chloride: 106 mEq/L (ref 96–112)
Creatinine, Ser: 0.89 mg/dL (ref 0.40–1.20)
GFR: 71.7 mL/min (ref >60.00)
GLUCOSE: 81 mg/dL (ref 70–99)
POTASSIUM: 3.9 meq/L (ref 3.5–5.1)
Sodium: 139 mEq/L (ref 135–145)

## 2014-12-31 LAB — HEPATIC FUNCTION PANEL
ALK PHOS: 95 U/L (ref 39–117)
ALT: 15 U/L (ref 0–35)
AST: 17 U/L (ref 0–37)
Albumin: 3.8 g/dL (ref 3.5–5.2)
BILIRUBIN TOTAL: 0.4 mg/dL (ref 0.2–1.2)
Bilirubin, Direct: 0.1 mg/dL (ref 0.0–0.3)
Total Protein: 7.3 g/dL (ref 6.0–8.3)

## 2014-12-31 LAB — CBC WITH DIFFERENTIAL/PLATELET
BASOS PCT: 0.3 % (ref 0.0–3.0)
Basophils Absolute: 0 10*3/uL (ref 0.0–0.1)
EOS PCT: 1.1 % (ref 0.0–5.0)
Eosinophils Absolute: 0.1 10*3/uL (ref 0.0–0.7)
HEMATOCRIT: 39.1 % (ref 36.0–46.0)
Hemoglobin: 13.7 g/dL (ref 12.0–15.0)
LYMPHS ABS: 2.2 10*3/uL (ref 0.7–4.0)
Lymphocytes Relative: 31.3 % (ref 12.0–46.0)
MCHC: 35.1 g/dL (ref 30.0–36.0)
MCV: 89.8 fl (ref 78.0–100.0)
MONO ABS: 0.5 10*3/uL (ref 0.1–1.0)
Monocytes Relative: 7.4 % (ref 3.0–12.0)
Neutro Abs: 4.3 10*3/uL (ref 1.4–7.7)
Neutrophils Relative %: 59.9 % (ref 43.0–77.0)
PLATELETS: 256 10*3/uL (ref 150.0–400.0)
RBC: 4.36 Mil/uL (ref 3.87–5.11)
RDW: 13 % (ref 11.5–15.5)
WBC: 7.2 10*3/uL (ref 4.0–10.5)

## 2014-12-31 LAB — LIPID PANEL
Cholesterol: 179 mg/dL (ref 0–200)
HDL: 53 mg/dL (ref >39.00)
LDL Cholesterol: 107 mg/dL — ABNORMAL HIGH (ref 0–99)
NonHDL: 126
Total CHOL/HDL Ratio: 3
Triglycerides: 96 mg/dL (ref 0.0–149.0)
VLDL: 19.2 mg/dL (ref 0.0–40.0)

## 2014-12-31 LAB — TSH: TSH: 0.04 u[IU]/mL — ABNORMAL LOW (ref 0.35–4.50)

## 2014-12-31 LAB — URIC ACID: Uric Acid, Serum: 4.8 mg/dL (ref 2.4–7.0)

## 2015-01-13 ENCOUNTER — Ambulatory Visit (INDEPENDENT_AMBULATORY_CARE_PROVIDER_SITE_OTHER): Payer: 59 | Admitting: Family Medicine

## 2015-01-13 ENCOUNTER — Encounter: Payer: Self-pay | Admitting: Family Medicine

## 2015-01-13 VITALS — BP 120/80 | Temp 98.0°F | Ht 64.5 in | Wt 239.0 lb

## 2015-01-13 DIAGNOSIS — Z0000 Encounter for general adult medical examination without abnormal findings: Secondary | ICD-10-CM

## 2015-01-13 DIAGNOSIS — I10 Essential (primary) hypertension: Secondary | ICD-10-CM | POA: Diagnosis not present

## 2015-01-13 DIAGNOSIS — F341 Dysthymic disorder: Secondary | ICD-10-CM

## 2015-01-13 DIAGNOSIS — E039 Hypothyroidism, unspecified: Secondary | ICD-10-CM | POA: Diagnosis not present

## 2015-01-13 DIAGNOSIS — G441 Vascular headache, not elsewhere classified: Secondary | ICD-10-CM | POA: Diagnosis not present

## 2015-01-13 DIAGNOSIS — E663 Overweight: Secondary | ICD-10-CM

## 2015-01-13 MED ORDER — LEVOTHYROXINE SODIUM 175 MCG PO TABS: 175.0000 ug | ORAL_TABLET | Freq: Every day | ORAL | Status: DC

## 2015-01-13 MED ORDER — SUMATRIPTAN SUCCINATE 100 MG PO TABS: ORAL_TABLET | ORAL | Status: DC

## 2015-01-13 MED ORDER — LISINOPRIL 10 MG PO TABS: 10.0000 mg | ORAL_TABLET | Freq: Every day | ORAL | Status: DC

## 2015-01-13 MED ORDER — AMITRIPTYLINE HCL 25 MG PO TABS: 25.0000 mg | ORAL_TABLET | Freq: Every day | ORAL | Status: DC

## 2015-01-13 NOTE — Progress Notes (Signed)
Pre visit review using our clinic review tool, if applicable. No additional management support is needed unless otherwise documented below in the visit note. 

## 2015-01-13 NOTE — Progress Notes (Signed)
   Subjective:    Patient ID: Maria Boone, female    DOB: 1966-02-02, 49 y.o.   MRN: [id number]  HPI Maria Boone is a 49 year old married female nonsmoker who comes in today for general physical examination because of a history of obesity, hypothyroidism, migraine headaches, hypertension  She says she feels well. She had a right knee replaced in January and is begin a diet and exercise program. She's trying to avoid sodas. Weight down to 239.  She gets routine eye care, regular dental care, BSE monthly, annual mammography, colonoscopy at age 14. No family history of colon cancer polyps LMP was April 2016. Last Pap October 2014 normal therefore repeat Pap next year     Review of Systems  Constitutional: Negative.   HENT: Negative.   Eyes: Negative.   Respiratory: Negative.   Cardiovascular: Negative.   Gastrointestinal: Negative.   Endocrine: Negative.   Genitourinary: Negative.   Musculoskeletal: Negative.   Skin: Negative.   Allergic/Immunologic: Negative.   Neurological: Negative.   Hematological: Negative.   Psychiatric/Behavioral: Negative.        Objective:   Physical Exam  Constitutional: She appears well-developed and well-nourished.  HENT:  Head: Normocephalic and atraumatic.  Right Ear: External ear normal.  Left Ear: External ear normal.  Nose: Nose normal.  Mouth/Throat: Oropharynx is clear and moist.  Eyes: EOM are normal. Pupils are equal, round, and reactive to light.  Neck: Normal range of motion. Neck supple. No JVD present. No tracheal deviation present. No thyromegaly present.  Cardiovascular: Normal rate, regular rhythm, normal heart sounds and intact distal pulses.  Exam reveals no gallop and no friction rub.   No murmur heard. Pulmonary/Chest: Effort normal and breath sounds normal. No stridor. No respiratory distress. She has no wheezes. She has no rales. She exhibits no tenderness.  Abdominal: Soft. Bowel sounds are normal. She exhibits no  distension and no mass. There is no tenderness. There is no rebound and no guarding.  Genitourinary:  Bilateral breast exam normal  Musculoskeletal: Normal range of motion.  Lymphadenopathy:    She has no cervical adenopathy.  Neurological: She is alert. She has normal reflexes. No cranial nerve deficit. She exhibits normal muscle tone. Coordination normal.  Skin: Skin is warm and dry. No rash noted. No erythema. No pallor.  Psychiatric: She has a normal mood and affect. Her behavior is normal. Judgment and thought content normal.          Assessment & Plan:  Healthy female..  Obesity........ continue diet exercise and weight loss  Hypothyroidism continue Synthroid  Hypertension..... Continue lisinopril 10 mg daily  Migraine headaches......... decrease with frequency and severity only 3 in the last 12 months......... Imitrex when necessary.

## 2015-01-13 NOTE — Patient Instructions (Signed)
Continue current medications  Continue diet and exercise program  Follow-up in one year sooner if any problems  [employer].........Marland Kitchen our new adult nurse practitioner from [facility]

## 2015-02-21 ENCOUNTER — Ambulatory Visit (INDEPENDENT_AMBULATORY_CARE_PROVIDER_SITE_OTHER): Payer: 59 | Admitting: Nurse Practitioner

## 2015-02-21 ENCOUNTER — Encounter: Payer: Self-pay | Admitting: Nurse Practitioner

## 2015-02-21 VITALS — BP 123/79 | HR 65 | Temp 98.4°F | Resp 16 | Ht 64.5 in | Wt 238.0 lb

## 2015-02-21 DIAGNOSIS — N649 Disorder of breast, unspecified: Secondary | ICD-10-CM | POA: Diagnosis not present

## 2015-02-21 NOTE — Progress Notes (Signed)
Subjective:     Maria Boone is an 49 y.o. female who presents for evaluation of a L breast mass. Change was noted a few days ago, and has been stable since first identified. Patient does routinely do self breast exams. The mass is not tender. Patient denies nipple discharge. Breast cancer risk factors include: none. Still having MC-regular.  She had MMg 10/2014- neg for malignancy, fibroglandular disease. She had MMG 2015-nml, and 2011 had same findings as 2016.  The following portions of the patient's history were reviewed and updated as appropriate: allergies, current medications, past family history, past medical history, past social history, past surgical history and problem list.  Review of Systems Constitutional: negative for fatigue, fevers, night sweats and weight loss     Objective:    BP 123/79 mmHg  Pulse 65  Temp(Src) 98.4 F (36.9 C) (Temporal)  Resp 16  Ht 5' 4.5" (1.638 m)  Wt 238 lb (107.956 kg)  BMI 40.24 kg/m2  SpO2 99%  LMP 02/07/2015 (Approximate) General appearance: alert, cooperative, appears stated age and no distress Eyes: negative findings: lids and lashes normal and conjunctivae and sclerae normal Breasts: Inspection negative, No nipple retraction or dimpling, No nipple discharge or bleeding, No axillary or supraclavicular adenopathy, Normal to palpation without dominant masses, Taught monthly breast self examination, pt has  alot of ropey tissue bialt. She has pea-sized nodule under L nipple, deep, round, moveable, NT Neurologic: Grossly normal    Assessment:Plan  1. 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 Continue to get regular MMG F/u prn concerns.

## 2015-02-21 NOTE — Progress Notes (Signed)
Pre visit review using our clinic review tool, if applicable. No additional management support is needed unless otherwise documented below in the visit note. 

## 2015-02-21 NOTE — Patient Instructions (Signed)
Your breast exam is reassuring. You have a lot of elongated ropey tissue that is normal. The small round nodule in L breast is moveable & likely a cyst-consistent with mammogram findings of fibroglandular disease.   If you feel lesion changes in size or shape, let us know.  Continue to get regular mammograms.   Nice to meet you! Have a great beach trip!

## 2015-08-24 ENCOUNTER — Ambulatory Visit (INDEPENDENT_AMBULATORY_CARE_PROVIDER_SITE_OTHER): Payer: 59 | Admitting: Family Medicine

## 2015-08-24 ENCOUNTER — Encounter: Payer: Self-pay | Admitting: Family Medicine

## 2015-08-24 VITALS — BP 112/73 | HR 64 | Temp 98.2°F | Resp 18 | Wt 239.0 lb

## 2015-08-24 DIAGNOSIS — J0100 Acute maxillary sinusitis, unspecified: Secondary | ICD-10-CM | POA: Diagnosis not present

## 2015-08-24 DIAGNOSIS — J029 Acute pharyngitis, unspecified: Secondary | ICD-10-CM

## 2015-08-24 LAB — POCT RAPID STREP A (OFFICE): Rapid Strep A Screen: NEGATIVE

## 2015-08-24 MED ORDER — DOXYCYCLINE HYCLATE 100 MG PO TABS
100.0000 mg | ORAL_TABLET | Freq: Two times a day (BID) | ORAL | Status: DC
Start: 2015-08-24 — End: 2015-09-09

## 2015-08-24 MED ORDER — FLUTICASONE PROPIONATE 50 MCG/ACT NA SUSP: 2.0000 | Freq: Every day | NASAL | Status: AC

## 2015-08-24 NOTE — Progress Notes (Signed)
   Subjective:    Patient ID: Maria Boone, female    DOB: 1966/07/05, 49 y.o.   MRN: [id number]  HPI   Nasal congestion: Patient reports with a greater than 5 day history of scratchy throat, rhinorrhea, nasal congestion, nausea, low-grade fever and mild cough. Patient's husband and son have both been sick and are being treated on antibiotics. She denies chills, vomit, diarrhea or rash. She is eating and drinking well. She is up-to-date on her flu and her tetanus vaccinations. She has no history of asthma or COPD. She has been taking over-the-counter remedies such as DayQuil/NyQuil to help with her symptoms.  Never smoker  Past Medical History  Diagnosis Date  . Migraine   . Obese   . Hypertension   . Thyroid disease    Allergies  Allergen Reactions  . Amoxicillin   . Percocet [Oxycodone-Acetaminophen] Itching    Nausea also  . Nitrofurantoin Monohyd Macro Rash   Review of Systems Negative, with the exception of above mentioned in HPI     Objective:   Physical Exam BP 112/73 mmHg  Pulse 64  Temp(Src) 98.2 F (36.8 C) (Temporal)  Resp 18  Wt 239 lb (108.41 kg)  SpO2 99% Gen: Afebrile. No acute distress. Nontoxic in appearance, well-developed, well-nourished Caucasian female. Very pleasant. HENT: AT. [place]. Bilateral TM visualized no erythema or bulging, full/fluid-filled. MMM, no oral lesions Bilateral nares erythema and swelling. Throat with mild erythema, no exudates. Starting present. Cough present on exam. Hoarseness present on exam. Tender to palpation right maxillary sinus. Eyes:Pupils Equal Round Reactive to light, Extraocular movements intact,  Conjunctiva without redness, discharge or icterus. Neck/lymp/endocrine: Supple, bilateral anterior cervical lymphadenopathy CV: RRR Chest: CTAB, no wheeze or crackles. Good air movement, normal respiratory effort Abd: Soft. NTND. BS present.   Skin: No rashes, purpura or petechiae.      Assessment & Plan:  1. Sore  throat - POCT rapid strep A--> Negative  2. Acute maxillary sinusitis, recurrence not specified - Rest, hydrate, Flonase, doxycycline. Patient was encouraged to use a humidifier - doxycycline (VIBRA-TABS) 100 MG tablet; Take 1 tablet (100 mg total) by mouth 2 (two) times daily.  Dispense: 20 tablet; Refill: 0 - fluticasone (FLONASE) 50 MCG/ACT nasal spray; Place 2 sprays into both nostrils daily.  Dispense: 16 g; Refill: 6 Follow-up as needed

## 2015-08-24 NOTE — Patient Instructions (Signed)
Sinusitis, Adult Sinusitis is redness, soreness, and inflammation of the paranasal sinuses. Paranasal sinuses are air pockets within the bones of your face. They are located beneath your eyes, in the middle of your forehead, and above your eyes. In healthy paranasal sinuses, mucus is able to drain out, and air is able to circulate through them by way of your nose. However, when your paranasal sinuses are inflamed, mucus and air can become trapped. This can allow bacteria and other germs to grow and cause infection. Sinusitis can develop quickly and last only a short time (acute) or continue over a long period (chronic). Sinusitis that lasts for more than 12 weeks is considered chronic. CAUSES Causes of sinusitis include:  Allergies.  Structural abnormalities, such as displacement of the cartilage that separates your nostrils (deviated septum), which can decrease the air flow through your nose and sinuses and affect sinus drainage.  Functional abnormalities, such as when the small hairs (cilia) that line your sinuses and help remove mucus do not work properly or are not present. SIGNS AND SYMPTOMS Symptoms of acute and chronic sinusitis are the same. The primary symptoms are pain and pressure around the affected sinuses. Other symptoms include:  Upper toothache.  Earache.  Headache.  Bad breath.  Decreased sense of smell and taste.  A cough, which worsens when you are lying flat.  Fatigue.  Fever.  Thick drainage from your nose, which often is green and may contain pus (purulent).  Swelling and warmth over the affected sinuses. DIAGNOSIS Your health care provider will perform a physical exam. During your exam, your health care provider may perform any of the following to help determine if you have acute sinusitis or chronic sinusitis:  Look in your nose for signs of abnormal growths in your nostrils (nasal polyps).  Tap over the affected sinus to check for signs of  infection.  View the inside of your sinuses using an imaging device that has a light attached (endoscope). If your health care provider suspects that you have chronic sinusitis, one or more of the following tests may be recommended:  Allergy tests.  Nasal culture. A sample of mucus is taken from your nose, sent to a lab, and screened for bacteria.  Nasal cytology. A sample of mucus is taken from your nose and examined by your health care provider to determine if your sinusitis is related to an allergy. TREATMENT Most cases of acute sinusitis are related to a viral infection and will resolve on their own within 10 days. Sometimes, medicines are prescribed to help relieve symptoms of both acute and chronic sinusitis. These may include pain medicines, decongestants, nasal steroid sprays, or saline sprays. However, for sinusitis related to a bacterial infection, your health care provider will prescribe antibiotic medicines. These are medicines that will help kill the bacteria causing the infection. Rarely, sinusitis is caused by a fungal infection. In these cases, your health care provider will prescribe antifungal medicine. For some cases of chronic sinusitis, surgery is needed. Generally, these are cases in which sinusitis recurs more than 3 times per year, despite other treatments. HOME CARE INSTRUCTIONS  Drink plenty of water. Water helps thin the mucus so your sinuses can drain more easily.  Use a humidifier.  Inhale steam 3-4 times a day (for example, sit in the bathroom with the shower running).  Apply a warm, moist washcloth to your face 3-4 times a day, or as directed by your health care provider.  Use saline nasal sprays to help   moisten and clean your sinuses.  Take medicines only as directed by your health care provider.  If you were prescribed either an antibiotic or antifungal medicine, finish it all even if you start to feel better. SEEK IMMEDIATE MEDICAL CARE IF:  You have  increasing pain or severe headaches.  You have nausea, vomiting, or drowsiness.  You have swelling around your face.  You have vision problems.  You have a stiff neck.  You have difficulty breathing.   This information is not intended to replace advice given to you by your health care provider. Make sure you discuss any questions you have with your health care provider.   Document Released: 08/13/2005 Document Revised: 09/03/2014 Document Reviewed: 08/28/2011 Elsevier Interactive Patient Education 2016 [employer].  Flonase, doxycycline, humidifier, rest, hydrate

## 2015-08-24 NOTE — Progress Notes (Signed)
Pre visit review using our clinic review tool, if applicable. No additional management support is needed unless otherwise documented below in the visit note. 

## 2015-09-02 ENCOUNTER — Other Ambulatory Visit: Payer: Self-pay | Admitting: Family Medicine

## 2015-09-02 ENCOUNTER — Encounter (INDEPENDENT_AMBULATORY_CARE_PROVIDER_SITE_OTHER): Payer: 59 | Admitting: Family Medicine

## 2015-09-02 DIAGNOSIS — Z0000 Encounter for general adult medical examination without abnormal findings: Secondary | ICD-10-CM

## 2015-09-02 DIAGNOSIS — I10 Essential (primary) hypertension: Secondary | ICD-10-CM

## 2015-09-02 DIAGNOSIS — E039 Hypothyroidism, unspecified: Secondary | ICD-10-CM

## 2015-09-02 LAB — CBC WITH DIFFERENTIAL/PLATELET
BASOS ABS: 0 10*3/uL (ref 0.0–0.1)
Basophils Relative: 0.3 % (ref 0.0–3.0)
EOS ABS: 0.1 10*3/uL (ref 0.0–0.7)
Eosinophils Relative: 0.8 % (ref 0.0–5.0)
HEMATOCRIT: 41.7 % (ref 36.0–46.0)
HEMOGLOBIN: 14 g/dL (ref 12.0–15.0)
LYMPHS PCT: 31.1 % (ref 12.0–46.0)
Lymphs Abs: 2.9 10*3/uL (ref 0.7–4.0)
MCHC: 33.7 g/dL (ref 30.0–36.0)
MCV: 92.6 fl (ref 78.0–100.0)
MONOS PCT: 5.9 % (ref 3.0–12.0)
Monocytes Absolute: 0.5 10*3/uL (ref 0.1–1.0)
NEUTROS ABS: 5.7 10*3/uL (ref 1.4–7.7)
Neutrophils Relative %: 61.9 % (ref 43.0–77.0)
PLATELETS: 270 10*3/uL (ref 150.0–400.0)
RBC: 4.5 Mil/uL (ref 3.87–5.11)
RDW: 13.1 % (ref 11.5–15.5)
WBC: 9.2 10*3/uL (ref 4.0–10.5)

## 2015-09-02 LAB — COMPREHENSIVE METABOLIC PANEL
ALK PHOS: 107 U/L (ref 39–117)
ALT: 8 U/L (ref 0–35)
AST: 12 U/L (ref 0–37)
Albumin: 4 g/dL (ref 3.5–5.2)
BILIRUBIN TOTAL: 0.5 mg/dL (ref 0.2–1.2)
BUN: 12 mg/dL (ref 6–23)
CALCIUM: 9.8 mg/dL (ref 8.4–10.5)
CO2: 27 meq/L (ref 19–32)
CREATININE: 0.76 mg/dL (ref 0.40–1.20)
Chloride: 105 mEq/L (ref 96–112)
GFR: 85.8 mL/min (ref >60.00)
GLUCOSE: 81 mg/dL (ref 70–99)
Potassium: 4.2 mEq/L (ref 3.5–5.1)
Sodium: 140 mEq/L (ref 135–145)
TOTAL PROTEIN: 6.9 g/dL (ref 6.0–8.3)

## 2015-09-02 LAB — LIPID PANEL
CHOL/HDL RATIO: 4
Cholesterol: 195 mg/dL (ref 0–200)
HDL: 52.3 mg/dL (ref >39.00)
LDL Cholesterol: 119 mg/dL — ABNORMAL HIGH (ref 0–99)
NONHDL: 142.98
TRIGLYCERIDES: 120 mg/dL (ref 0.0–149.0)
VLDL: 24 mg/dL (ref 0.0–40.0)

## 2015-09-02 LAB — HEMOGLOBIN A1C: HEMOGLOBIN A1C: 5.2 % (ref 4.6–6.5)

## 2015-09-02 LAB — TSH: TSH: 0.04 u[IU]/mL — ABNORMAL LOW (ref 0.35–4.50)

## 2015-09-05 ENCOUNTER — Telehealth: Payer: Self-pay | Admitting: Family Medicine

## 2015-09-05 DIAGNOSIS — E039 Hypothyroidism, unspecified: Secondary | ICD-10-CM

## 2015-09-05 MED ORDER — LEVOTHYROXINE SODIUM 150 MCG PO TABS: 150.0000 ug | ORAL_TABLET | Freq: Every day | ORAL | Status: DC

## 2015-09-05 NOTE — Telephone Encounter (Signed)
Please call pt: - Her labs are normal with the following exceptions:   - Her thyroid is over supplemented at 0.04. I have called the lower dose for her to start and we will need to re-test on 7-8 weeks. We can discuss in more detail at her appt coming up (rescheduled from me being ill Friday?).

## 2015-09-05 NOTE — Telephone Encounter (Signed)
Spoke with patient reviewed labs and instructions.

## 2015-09-06 ENCOUNTER — Encounter: Payer: Self-pay | Admitting: Family Medicine

## 2015-09-06 NOTE — Progress Notes (Signed)
error 

## 2015-09-09 ENCOUNTER — Ambulatory Visit (INDEPENDENT_AMBULATORY_CARE_PROVIDER_SITE_OTHER): Payer: 59 | Admitting: Family Medicine

## 2015-09-09 ENCOUNTER — Encounter: Payer: Self-pay | Admitting: Family Medicine

## 2015-09-09 ENCOUNTER — Other Ambulatory Visit (HOSPITAL_COMMUNITY)
Admission: RE | Admit: 2015-09-09 | Discharge: 2015-09-09 | Disposition: A | Discharge status: Home / Self Care | Payer: 59 | Source: Ambulatory Visit | Attending: Family Medicine | Admitting: Family Medicine

## 2015-09-09 VITALS — BP 132/81 | HR 60 | Temp 97.9°F | Resp 20 | Ht 64.5 in | Wt 240.8 lb

## 2015-09-09 DIAGNOSIS — I10 Essential (primary) hypertension: Secondary | ICD-10-CM

## 2015-09-09 DIAGNOSIS — Z124 Encounter for screening for malignant neoplasm of cervix: Secondary | ICD-10-CM | POA: Insufficient documentation

## 2015-09-09 DIAGNOSIS — Z131 Encounter for screening for diabetes mellitus: Secondary | ICD-10-CM | POA: Insufficient documentation

## 2015-09-09 DIAGNOSIS — Z0000 Encounter for general adult medical examination without abnormal findings: Secondary | ICD-10-CM | POA: Diagnosis not present

## 2015-09-09 DIAGNOSIS — Z01419 Encounter for gynecological examination (general) (routine) without abnormal findings: Secondary | ICD-10-CM | POA: Insufficient documentation

## 2015-09-09 DIAGNOSIS — Z1151 Encounter for screening for human papillomavirus (HPV): Secondary | ICD-10-CM | POA: Insufficient documentation

## 2015-09-09 DIAGNOSIS — Z1239 Encounter for other screening for malignant neoplasm of breast: Secondary | ICD-10-CM

## 2015-09-09 DIAGNOSIS — E039 Hypothyroidism, unspecified: Secondary | ICD-10-CM

## 2015-09-09 NOTE — Patient Instructions (Signed)
Health Maintenance, Female Adopting a healthy lifestyle and getting preventive care can go a long way to promote health and wellness. Talk with your health care provider about what schedule of regular examinations is right for you. This is a good chance for you to check in with your provider about disease prevention and staying healthy. In between checkups, there are plenty of things you can do on your own. Experts have done a lot of research about which lifestyle changes and preventive measures are most likely to keep you healthy. Ask your health care provider for more information. WEIGHT AND DIET  Eat a healthy diet  Be sure to include plenty of vegetables, fruits, low-fat dairy products, and lean protein.  Do not eat a lot of foods high in solid fats, added sugars, or salt.  Get regular exercise. This is one of the most important things you can do for your health.  Most adults should exercise for at least 150 minutes each week. The exercise should increase your heart rate and make you sweat (moderate-intensity exercise).  Most adults should also do strengthening exercises at least twice a week. This is in addition to the moderate-intensity exercise.  Maintain a healthy weight  Body mass index (BMI) is a measurement that can be used to identify possible weight problems. It estimates body fat based on height and weight. Your health care provider can help determine your BMI and help you achieve or maintain a healthy weight.  For females 20 years of age and older:   A BMI below 18.5 is considered underweight.  A BMI of 18.5 to 24.9 is normal.  A BMI of 25 to 29.9 is considered overweight.  A BMI of 30 and above is considered obese.  Watch levels of cholesterol and blood lipids  You should start having your blood tested for lipids and cholesterol at 50 years of age, then have this test every 5 years.  You may need to have your cholesterol levels checked more often if:  Your lipid  or cholesterol levels are high.  You are older than 50 years of age.  You are at high risk for heart disease.  CANCER SCREENING   Lung Cancer  Lung cancer screening is recommended for adults 55-80 years old who are at high risk for lung cancer because of a history of smoking.  A yearly low-dose CT scan of the lungs is recommended for people who:  Currently smoke.  Have quit within the past 15 years.  Have at least a 30-pack-year history of smoking. A pack year is smoking an average of one pack of cigarettes a day for 1 year.  Yearly screening should continue until it has been 15 years since you quit.  Yearly screening should stop if you develop a health problem that would prevent you from having lung cancer treatment.  Breast Cancer  Practice breast self-awareness. This means understanding how your breasts normally appear and feel.  It also means doing regular breast self-exams. Let your health care provider know about any changes, no matter how small.  If you are in your 20s or 30s, you should have a clinical breast exam (CBE) by a health care provider every 1-3 years as part of a regular health exam.  If you are 40 or older, have a CBE every year. Also consider having a breast X-ray (mammogram) every year.  If you have a family history of breast cancer, talk to your health care provider about genetic screening.  If you   are at high risk for breast cancer, talk to your health care provider about having an MRI and a mammogram every year.  Breast cancer gene (BRCA) assessment is recommended for women who have family members with BRCA-related cancers. BRCA-related cancers include:  Breast.  Ovarian.  Tubal.  Peritoneal cancers.  Results of the assessment will determine the need for genetic counseling and BRCA1 and BRCA2 testing. Cervical Cancer Your health care provider may recommend that you be screened regularly for cancer of the pelvic organs (ovaries, uterus, and  vagina). This screening involves a pelvic examination, including checking for microscopic changes to the surface of your cervix (Pap test). You may be encouraged to have this screening done every 3 years, beginning at age 21.  For women ages 30-65, health care providers may recommend pelvic exams and Pap testing every 3 years, or they may recommend the Pap and pelvic exam, combined with testing for human papilloma virus (HPV), every 5 years. Some types of HPV increase your risk of cervical cancer. Testing for HPV may also be done on women of any age with unclear Pap test results.  Other health care providers may not recommend any screening for nonpregnant women who are considered low risk for pelvic cancer and who do not have symptoms. Ask your health care provider if a screening pelvic exam is right for you.  If you have had past treatment for cervical cancer or a condition that could lead to cancer, you need Pap tests and screening for cancer for at least 20 years after your treatment. If Pap tests have been discontinued, your risk factors (such as having a new sexual partner) need to be reassessed to determine if screening should resume. Some women have medical problems that increase the chance of getting cervical cancer. In these cases, your health care provider may recommend more frequent screening and Pap tests. Colorectal Cancer  This type of cancer can be detected and often prevented.  Routine colorectal cancer screening usually begins at 50 years of age and continues through 50 years of age.  Your health care provider may recommend screening at an earlier age if you have risk factors for colon cancer.  Your health care provider may also recommend using home test kits to check for hidden blood in the stool.  A small camera at the end of a tube can be used to examine your colon directly (sigmoidoscopy or colonoscopy). This is done to check for the earliest forms of colorectal  cancer.  Routine screening usually begins at age 50.  Direct examination of the colon should be repeated every 5-10 years through 50 years of age. However, you may need to be screened more often if early forms of precancerous polyps or small growths are found. Skin Cancer  Check your skin from head to toe regularly.  Tell your health care provider about any new moles or changes in moles, especially if there is a change in a mole's shape or color.  Also tell your health care provider if you have a mole that is larger than the size of a pencil eraser.  Always use sunscreen. Apply sunscreen liberally and repeatedly throughout the day.  Protect yourself by wearing long sleeves, pants, a wide-brimmed hat, and sunglasses whenever you are outside. HEART DISEASE, DIABETES, AND HIGH BLOOD PRESSURE   High blood pressure causes heart disease and increases the risk of stroke. High blood pressure is more likely to develop in:  People who have blood pressure in the high end   of the normal range (130-139/85-89 mm Hg).  People who are overweight or obese.  People who are African American.  If you are 38-23 years of age, have your blood pressure checked every 3-5 years. If you are 61 years of age or older, have your blood pressure checked every year. You should have your blood pressure measured twice--once when you are at a hospital or clinic, and once when you are not at a hospital or clinic. Record the average of the two measurements. To check your blood pressure when you are not at a hospital or clinic, you can use:  An automated blood pressure machine at a pharmacy.  A home blood pressure monitor.  If you are between 45 years and 39 years old, ask your health care provider if you should take aspirin to prevent strokes.  Have regular diabetes screenings. This involves taking a blood sample to check your fasting blood sugar level.  If you are at a normal weight and have a low risk for diabetes,  have this test once every three years after 50 years of age.  If you are overweight and have a high risk for diabetes, consider being tested at a younger age or more often. PREVENTING INFECTION  Hepatitis B  If you have a higher risk for hepatitis B, you should be screened for this virus. You are considered at high risk for hepatitis B if:  You were born in a country where hepatitis B is common. Ask your health care provider which countries are considered high risk.  Your parents were born in a high-risk country, and you have not been immunized against hepatitis B (hepatitis B vaccine).  You have HIV or AIDS.  You use needles to inject street drugs.  You live with someone who has hepatitis B.  You have had sex with someone who has hepatitis B.  You get hemodialysis treatment.  You take certain medicines for conditions, including cancer, organ transplantation, and autoimmune conditions. Hepatitis C  Blood testing is recommended for:  Everyone born from [birth year] through 1965.  Anyone with known risk factors for hepatitis C. Sexually transmitted infections (STIs)  You should be screened for sexually transmitted infections (STIs) including gonorrhea and chlamydia if:  You are sexually active and are younger than 50 years of age.  You are older than 50 years of age and your health care provider tells you that you are at risk for this type of infection.  Your sexual activity has changed since you were last screened and you are at an increased risk for chlamydia or gonorrhea. Ask your health care provider if you are at risk.  If you do not have HIV, but are at risk, it may be recommended that you take a prescription medicine daily to prevent HIV infection. This is called pre-exposure prophylaxis (PrEP). You are considered at risk if:  You are sexually active and do not regularly use condoms or know the HIV status of your partner(s).  You take drugs by injection.  You are sexually  active with a partner who has HIV. Talk with your health care provider about whether you are at high risk of being infected with HIV. If you choose to begin PrEP, you should first be tested for HIV. You should then be tested every 3 months for as long as you are taking PrEP.  PREGNANCY   If you are premenopausal and you may become pregnant, ask your health care provider about preconception counseling.  If you may  become pregnant, take 400 to 800 micrograms (mcg) of folic acid every day.  If you want to prevent pregnancy, talk to your health care provider about birth control (contraception). OSTEOPOROSIS AND MENOPAUSE   Osteoporosis is a disease in which the bones lose minerals and strength with aging. This can result in serious bone fractures. Your risk for osteoporosis can be identified using a bone density scan.  If you are 30 years of age or older, or if you are at risk for osteoporosis and fractures, ask your health care provider if you should be screened.  Ask your health care provider whether you should take a calcium or vitamin D supplement to lower your risk for osteoporosis.  Menopause may have certain physical symptoms and risks.  Hormone replacement therapy may reduce some of these symptoms and risks. Talk to your health care provider about whether hormone replacement therapy is right for you.  HOME CARE INSTRUCTIONS   Schedule regular health, dental, and eye exams.  Stay current with your immunizations.   Do not use any tobacco products including cigarettes, chewing tobacco, or electronic cigarettes.  If you are pregnant, do not drink alcohol.  If you are breastfeeding, limit how much and how often you drink alcohol.  Limit alcohol intake to no more than 1 drink per day for nonpregnant women. One drink equals 12 ounces of beer, 5 ounces of wine, or 1 ounces of hard liquor.  Do not use street drugs.  Do not share needles.  Ask your health care provider for help if  you need support or information about quitting drugs.  Tell your health care provider if you often feel depressed.  Tell your health care provider if you have ever been abused or do not feel safe at home.   This information is not intended to replace advice given to you by your health care provider. Make sure you discuss any questions you have with your health care provider.   Document Released: 02/26/2011 Document Revised: 09/03/2014 Document Reviewed: 07/15/2013 Elsevier Interactive Patient Education [employer].  F/U 6 months for chronic issues. Thyroid recheck in 3 months can be lab only as long as feeling ok.

## 2015-09-09 NOTE — Progress Notes (Signed)
Patient ID: Maria Boone, female   DOB: 1966/07/08, 50 y.o.   MRN: [id number]      Patient ID: Maria Boone, female   DOB: [birth year], 50 y.o.   MRN: [id number]  Subjective:  Maria Boone is a 50 y.o. female present for annual exam with gynecological exam/cervical cancer screening. All past medical history, surgical history, allergies, family history, immunizations, medications and social history were updated in the electronic medical record today. All recent labs, ED visits and hospitalizations within the last year were reviewed. All labs reviewed with patient today.  Hypothyroid: Patient is over supplemented with her thyroid medication, lower dose had been called in for her this week. Patient states she did start that today. She is taking her thyroid medication 5 times a week. She is to continue taking the lower dose at the same rate that she was taking the prior dose. She will need recheck in 8-12 weeks.  Well woman: Patient states she's had a history of dysfunctional uterine bleeding in the past. She is placed on birth control pills for some time, and then wants removed off the pill she states took a little of her fear for her menstrual cycle to return. Since then her menstrual cycle is typically normal every 30 days approximately. Not heavy in nature, normal length between 5-7 days. No breast cancer family history, no colon cancer family history. Patient denies any vaginal irritation, vaginal discharge, dyspareunia or postcoital bleeding. She does perform self breast exams.  Health maintenance:  Colonoscopy: No colon cancer in family. Routine at age 56 after July this year. Mammogram: 10/2014; no ABNL mammograms or fhx of breast cancer. Patient desires referral to replace for screening schedule. Cervical cancer screening: 2013 neg, no co-testing. ABNL with a LEEP cone when she was much younger. She states she's had many normal Pap since. Apical cancer screening/gynecological  exam completed today.  Immunizations: declined flu, tdap completed 2008 Infectious disease screening: HIV completed many years ago and does not desire repeat. DEXA: age screen at 27 Assistive device: None Oxygen use: None Patient has a Dental home. Hospitalizations/ED visits: None  Recent Results (from the past 2160 hour(s))  POCT rapid strep A     Status: Normal   Collection Time: 08/24/15  9:33 AM  Result Value Ref Range   Rapid Strep A Screen Negative Negative  CBC w/Diff     Status: None   Collection Time: 09/02/15  2:17 PM  Result Value Ref Range   WBC 9.2 4.0 - 10.5 K/uL   RBC 4.50 3.87 - 5.11 Mil/uL   Hemoglobin 14.0 12.0 - 15.0 g/dL   HCT 41.7 36.0 - 46.0 %   MCV 92.6 78.0 - 100.0 fl   MCHC 33.7 30.0 - 36.0 g/dL   RDW 13.1 11.5 - 15.5 %   Platelets 270.0 150.0 - 400.0 K/uL   Neutrophils Relative % 61.9 43.0 - 77.0 %   Lymphocytes Relative 31.1 12.0 - 46.0 %   Monocytes Relative 5.9 3.0 - 12.0 %   Eosinophils Relative 0.8 0.0 - 5.0 %   Basophils Relative 0.3 0.0 - 3.0 %   Neutro Abs 5.7 1.4 - 7.7 K/uL   Lymphs Abs 2.9 0.7 - 4.0 K/uL   Monocytes Absolute 0.5 0.1 - 1.0 K/uL   Eosinophils Absolute 0.1 0.0 - 0.7 K/uL   Basophils Absolute 0.0 0.0 - 0.1 K/uL  Comp Met (CMET)     Status: None   Collection Time: 09/02/15  2:17 PM  Result  Value Ref Range   Sodium 140 135 - 145 mEq/L   Potassium 4.2 3.5 - 5.1 mEq/L   Chloride 105 96 - 112 mEq/L   CO2 27 19 - 32 mEq/L   Glucose, Bld 81 70 - 99 mg/dL   BUN 12 6 - 23 mg/dL   Creatinine, Ser 0.76 0.40 - 1.20 mg/dL   Total Bilirubin 0.5 0.2 - 1.2 mg/dL   Alkaline Phosphatase 107 39 - 117 U/L   AST 12 0 - 37 U/L   ALT 8 0 - 35 U/L   Total Protein 6.9 6.0 - 8.3 g/dL   Albumin 4.0 3.5 - 5.2 g/dL   Calcium 9.8 8.4 - 10.5 mg/dL   GFR 85.80 >60.00 mL/min  Lipid panel     Status: Abnormal   Collection Time: 09/02/15  2:17 PM  Result Value Ref Range   Cholesterol 195 0 - 200 mg/dL    Comment: ATP III Classification        Desirable:  < 200 mg/dL               Borderline High:  200 - 239 mg/dL          High:  > = 240 mg/dL   Triglycerides 120.0 0.0 - 149.0 mg/dL    Comment: Normal:  <150 mg/dLBorderline High:  150 - 199 mg/dL   HDL 52.30 >39.00 mg/dL   VLDL 24.0 0.0 - 40.0 mg/dL   LDL Cholesterol 119 (H) 0 - 99 mg/dL   Total CHOL/HDL Ratio 4     Comment:                Men          Women1/2 Average Risk     3.4          3.3Average Risk          5.0          4.42X Average Risk          9.6          7.13X Average Risk          15.0          11.0                       NonHDL 142.98     Comment: NOTE:  Non-HDL goal should be 30 mg/dL higher than patient's LDL goal (i.e. LDL goal of < 70 mg/dL, would have non-HDL goal of < 100 mg/dL)  TSH     Status: Abnormal   Collection Time: 09/02/15  2:17 PM  Result Value Ref Range   TSH 0.04 (L) 0.35 - 4.50 uIU/mL  HgB A1c     Status: None   Collection Time: 09/02/15  2:17 PM  Result Value Ref Range   Hgb A1c MFr Bld 5.2 4.6 - 6.5 %    Comment: Glycemic Control Guidelines for People with Diabetes:Non Diabetic:  <6%Goal of Therapy: <7%Additional Action Suggested:  >8%      Past Medical History  Diagnosis Date  . Migraine   . Obese   . Hypertension   . Thyroid disease    Allergies  Allergen Reactions  . Penicillins Other (See Comments)    ORAL BLISTERS/SORES.  Marland Kitchen Amoxicillin   . Percocet [Oxycodone-Acetaminophen] Itching    Nausea also  . Nitrofurantoin Monohyd Macro Rash   Past Surgical History  Procedure Laterality Date  . Childbirth      x2  .  Knee surgery Right   . Cervical biopsy  w/ loop electrode excision     Family History  Problem Relation Age of Onset  . Hypertension Other   . Kidney disease Other   . Lymphoma Other   . Alzheimer's disease Mother   . Cancer Father     lymphoma, exposed to uranium  . Kidney disease Father   . Cancer Paternal Uncle     lymphoma, exposed to uranium   Social History   Social History  . Marital Status:  Married    Spouse Name: N/A  . Number of Children: N/A  . Years of Education: N/A   Occupational History  . Not on file.   Social History Main Topics  . Smoking status: Never Smoker   . Smokeless tobacco: Never Used  . Alcohol Use: No  . Drug Use: No  . Sexual Activity: Yes   Other Topics Concern  . Not on file   Social History Narrative   Married. William. Children 2.   Works for [employer].    Wears her seatbelt. Smoke alarm in the home. Firearms in the home (in locked cabinet).   Feels safe in her relationships.    ROS: Negative, with the exception of above mentioned in HPI  Objective: BP 132/81 mmHg  Pulse 60  Temp(Src) 97.9 F (36.6 C)  Resp 20  Ht 5' 4.5" (1.638 m)  Wt 240 lb 12.8 oz (109.226 kg)  BMI 40.71 kg/m2  SpO2 100%  LMP 08/18/2015 Gen: Afebrile. No acute distress. Nontoxic in appearance, well-developed, well-nourished, HENT: AT. [place]. Bilateral TM visualized and normal in appearance, normal external auditory canal. MMM, no oral lesions, good dentition. Bilateral nares without erythema or swelling. Throat without erythema, ulcerations or exudates. No no Cough on exam, no hoarseness on exam. Eyes:Pupils Equal Round Reactive to light, Extraocular movements intact,  Conjunctiva without redness, discharge or icterus. Neck/lymp/endocrine: Supple, no lymphadenopathy, no thyromegaly CV: RRR m/g/C/R, no edema, +2/4 P posterior tibialis pulses. No carotid bruits. No JVD. Chest: CTAB, no wheeze, rhonchi or crackles. Normal Respiratory effort. Good Air movement. Abd: Soft. Obese. NTND. BS present. No Masses palpated. No hepatosplenomegaly. No rebound tenderness or guarding. Skin: No rashes, purpura or petechiae. Warm and well-perfused. Skin intact. Neuro/Msk:  Normal gait. PERLA. EOMi. Alert. Oriented x3.  Cranial nerves II through XII intact. Muscle strength 5/5 upper and lower extremity. DTRs equal bilaterally. Psych: Normal affect, dress and demeanor. Normal  speech. Normal thought content and judgment. GYN:  External genitalia within normal limits.  Vaginal mucosa pink, moist, normal rugae.  Nonfriable cervix without lesions, very small cervical polyp, no discharge or bleeding noted on speculum exam.  Bimanual exam revealed normal, nongravid uterus.  No cervical motion tenderness. No adnexal masses bilaterally.     Assessment/plan: Maria Boone is a 50 y.o. female present for annual exam with PAP. Health maintenance:  Colonoscopy: No colon cancer in family. Routine at age 3 after July this year. Mammogram: 10/2014; no ABNL mammograms or fhx of breast cancer. Patient desires referral to replace for screening schedule. Cervical cancer screening: 2013 neg, no co-testing. ABNL with a LEEP cone when she was much younger. She states she's had many normal Pap since. Apical cancer screening/gynecological exam completed today.  Immunizations: declined flu, tdap completed 2008 Infectious disease screening: HIV completed many years ago and does not desire repeat. DEXA: age screen at 77 Assistive device: None Oxygen use: None Patient has a Dental home. Hospitalizations/ED visits: None Patient  was encouraged to exercise greater than 150 minutes a week. Patient was encouraged to choose a diet filled with fresh fruits and vegetables, and lean meats. - Vitamin D supplement daily, 800u - fish oil recommended.   - Patient to have TSH rechecked in 8-12 weeks, future order placed.  AVS provided to patient today for education/recommendation on gender specific health and safety maintenance. Return in about 6 months (around 03/08/2016) for HTN.   Howard Pouch, DO [place]

## 2015-09-13 ENCOUNTER — Encounter: Payer: Self-pay | Admitting: Family Medicine

## 2015-09-14 ENCOUNTER — Telehealth: Payer: Self-pay | Admitting: Family Medicine

## 2015-09-14 LAB — CYTOLOGY - PAP

## 2015-09-14 MED ORDER — FLUCONAZOLE 150 MG PO TABS
150.0000 mg | ORAL_TABLET | Freq: Once | ORAL | Status: DC
Start: 2015-09-14 — End: 2015-10-11

## 2015-09-14 NOTE — Telephone Encounter (Signed)
Please call pt: - her PAP was normal, with negative HPV. Next screen 3 years.  - Her pap did show signs of a yeast infection. I have called in a medication for her to take called diflucan, x1 dose.

## 2015-09-15 NOTE — Telephone Encounter (Signed)
Spoke with patient husband he said pharmacy had called last HS to let her know she had a Rx he will let her know results of her test.

## 2015-10-11 ENCOUNTER — Ambulatory Visit (INDEPENDENT_AMBULATORY_CARE_PROVIDER_SITE_OTHER): Payer: 59 | Admitting: Family Medicine

## 2015-10-11 ENCOUNTER — Encounter: Payer: Self-pay | Admitting: Family Medicine

## 2015-10-11 VITALS — BP 111/73 | HR 65 | Temp 98.0°F | Resp 20 | Wt 243.5 lb

## 2015-10-11 DIAGNOSIS — N898 Other specified noninflammatory disorders of vagina: Secondary | ICD-10-CM | POA: Diagnosis not present

## 2015-10-11 DIAGNOSIS — N9489 Other specified conditions associated with female genital organs and menstrual cycle: Secondary | ICD-10-CM

## 2015-10-11 MED ORDER — METRONIDAZOLE 500 MG PO TABS: 500.0000 mg | ORAL_TABLET | Freq: Three times a day (TID) | ORAL | Status: DC

## 2015-10-11 NOTE — Patient Instructions (Signed)

## 2015-10-11 NOTE — Progress Notes (Signed)
Patient ID: ZENNIE AYARS, female   DOB: [birth year], 50 y.o.   MRN: [id number]    TOBI GROESBECK , 03-02-1966, 50 y.o., female MRN: [id number]  CC: Vaginal Discharge Subjective: Pt presents for an acute OV with complaints of Vaginal DC of  1 week ago duration.  Associated symptoms include reddish/brown discharge, she notices on her toilet paper only. Mild "ammonia" smell. No vaginal irritation.  No change in sexual partners, dysparunia or abd pain. No fevers. No lesions or vaginal sores. Recent PAP normal.  She endorses Weekend before last had a migraine. She also had a pressure sensation anterior neck, over thyroid for one day that was like "pressure".   Menstrual cycle usually regular, every 28-30 days. She does get headaches sometimes before her periods. Recent lower in synthroid from 175 mcg to 150 mcg 4 weeks ago d/t over replaced.  No other peri-menopuasal symptoms, she did have hot flashes a few months ago but not currently.  Small cervical polyp on exam in 09/09/2015.  Allergies  Allergen Reactions  . Penicillins Other (See Comments)    ORAL BLISTERS/SORES.  Marland Kitchen Amoxicillin   . Percocet [Oxycodone-Acetaminophen] Itching    Nausea also  . Nitrofurantoin Monohyd Macro Rash   Social History  Substance Use Topics  . Smoking status: Never Smoker   . Smokeless tobacco: Never Used  . Alcohol Use: No   Past Medical History  Diagnosis Date  . Migraine   . Obese   . Hypertension   . Thyroid disease    Past Surgical History  Procedure Laterality Date  . Childbirth      x2  . Knee surgery Right   . Cervical biopsy  w/ loop electrode excision     Family History  Problem Relation Age of Onset  . Hypertension Other   . Kidney disease Other   . Lymphoma Other   . Alzheimer's disease Mother   . Cancer Father     lymphoma, exposed to uranium  . Kidney disease Father   . Cancer Paternal Uncle     lymphoma, exposed to uranium     Medication List       This list is  accurate as of: 10/11/15  3:43 PM.  Always use your most recent med list.               amitriptyline 25 MG tablet  Commonly known as:  ELAVIL  Take 1 tablet (25 mg total) by mouth at bedtime.     diclofenac 75 MG EC tablet  Commonly known as:  VOLTAREN  Take 75 mg by mouth 3 (three) times daily. Reported on 10/11/2015     ferrous sulfate 325 (65 FE) MG tablet  Take 325 mg by mouth daily with breakfast. Reported on 10/11/2015     fluticasone 50 MCG/ACT nasal spray  Commonly known as:  FLONASE  Place 2 sprays into both nostrils daily.     levothyroxine 150 MCG tablet  Commonly known as:  SYNTHROID, LEVOTHROID  Take 1 tablet (150 mcg total) by mouth daily before breakfast.     lisinopril 10 MG tablet  Commonly known as:  PRINIVIL,ZESTRIL  Take 1 tablet (10 mg total) by mouth daily.     LUMIGAN 0.01 % Soln  Generic drug:  bimatoprost  Apply 1 drop to eye at bedtime.     SUMAtriptan 100 MG tablet  Commonly known as:  IMITREX  1 po at onset of migraine , may repeat in 2hours . Not to  exceed 2 pills in 24hrs.     vitamin C 100 MG tablet  Take 100 mg by mouth.       ROS: Negative, with the exception of above mentioned in HPI Objective:  BP 111/73 mmHg  Pulse 65  Temp(Src) 98 F (36.7 C)  Resp 20  Wt 243 lb 8 oz (110.451 kg)  SpO2 100%  LMP 09/18/2015 Body mass index is 41.17 kg/(m^2). Gen: Afebrile. No acute distress. Nontoxic in appearance.  HENT: AT. [place]. MMM, no oral lesions.  Eyes:Pupils Equal Round Reactive to light, Extraocular movements intact,  Conjunctiva without redness, discharge or icterus. Neck/lymp/endocrine: Supple,  No  thyromegaly CV: RRR  Abd: Soft. NTND. BS present.  GYN:  External genitalia within normal limits.  Vaginal mucosa pink, moist, normal rugae.  Nonfriable cervix without lesions,brownmucously discharge at cervical os, small cervical polyp.  Bimanual exam revealed normal, nongravid uterus.  No cervical motion tenderness. No adnexal masses  bilaterally.    Assessment/Plan: SUANNE MINAHAN is a 50 y.o. female present for acute OV for  1. Vaginal discharge - appeared to be menstrual related, brownish/red blood from cervical OS noted. No lesions, normal PAP last month. She does have small cervical polyp, but not the source of bleeding.  - Wet prep, genital - metroNIDAZOLE (FLAGYL) 500 MG tablet; Take 1 tablet (500 mg total) by mouth 3 (three) times daily.  Dispense: 21 tablet; Refill: 0     Icelyn Navarrete, DO  [place]- [facility]

## 2015-11-07 ENCOUNTER — Telehealth: Payer: Self-pay | Admitting: Family Medicine

## 2015-11-07 NOTE — Telephone Encounter (Signed)
Scheduled patient for appt for evaluation of right shoulder.

## 2015-11-07 NOTE — Telephone Encounter (Signed)
Patient is requesting referral to orthopedist Dr. Darcella Cheshire for her R shoulder. She hurt it Saturday picking up a case of water.

## 2015-11-08 ENCOUNTER — Encounter: Payer: Self-pay | Admitting: Family Medicine

## 2015-11-08 ENCOUNTER — Ambulatory Visit: Payer: 59 | Admitting: Family Medicine

## 2015-11-08 ENCOUNTER — Ambulatory Visit (INDEPENDENT_AMBULATORY_CARE_PROVIDER_SITE_OTHER): Payer: 59 | Admitting: Family Medicine

## 2015-11-08 VITALS — BP 133/70 | HR 60 | Temp 97.8°F | Resp 20 | Wt 246.0 lb

## 2015-11-08 DIAGNOSIS — M7521 Bicipital tendinitis, right shoulder: Secondary | ICD-10-CM | POA: Diagnosis not present

## 2015-11-08 MED ORDER — PREDNISONE 20 MG PO TABS: ORAL_TABLET | ORAL | Status: DC

## 2015-11-08 MED ORDER — DICLOFENAC SODIUM 75 MG PO TBEC: 75.0000 mg | DELAYED_RELEASE_TABLET | Freq: Two times a day (BID) | ORAL | Status: DC

## 2015-11-08 NOTE — Progress Notes (Signed)
Patient ID: AVABELLA WAILES, female   DOB: 1965/11/08, 50 y.o.   MRN: [id number]    YAMILETTE GARRETSON , 1966/05/09, 50 y.o., female MRN: [id number]  CC: right shoulder injury Subjective: Pt reports lifting boards of wood about 1 week ago. She does not recall straining or injury at that time, however the next day she woke up to right shoulder discomfort and then later that next evening worsening pain. She thought initially she slept on her shoulder wrong. She states she then seen improvement until 3 days later she picked up a case of water and experienced pain. She states her arm hurt mid- bicep, points to bicep groove. She states she tried tramadol for pain, it was not effective and it makes her ill. She did take advil intermittently. She states the sensation was so intense she had to  Lift her right arm with her left arm when needing to move it. She states today her pain is mostly discomfort 2/10 at its worse.  She denies prior injury or surgery to her neck or shoulder/arm. She denies numbness or tingling distally.   Allergies  Allergen Reactions  . Penicillins Other (See Comments)    ORAL BLISTERS/SORES.  Marland Kitchen Amoxicillin   . Percocet [Oxycodone-Acetaminophen] Itching    Nausea also  . Nitrofurantoin Monohyd Macro Rash   Social History  Substance Use Topics  . Smoking status: Never Smoker   . Smokeless tobacco: Never Used  . Alcohol Use: No   Past Medical History  Diagnosis Date  . Migraine   . Obese   . Hypertension   . Thyroid disease    Past Surgical History  Procedure Laterality Date  . Childbirth      x2  . Knee surgery Right   . Cervical biopsy  w/ loop electrode excision     Family History  Problem Relation Age of Onset  . Hypertension Other   . Kidney disease Other   . Lymphoma Other   . Alzheimer's disease Mother   . Cancer Father     lymphoma, exposed to uranium  . Kidney disease Father   . Cancer Paternal Uncle     lymphoma, exposed to uranium       Medication List       This list is accurate as of: 11/08/15  3:17 PM.  Always use your most recent med list.               amitriptyline 25 MG tablet  Commonly known as:  ELAVIL  Take 1 tablet (25 mg total) by mouth at bedtime.     diclofenac 75 MG EC tablet  Commonly known as:  VOLTAREN  Take 75 mg by mouth 3 (three) times daily. Reported on 11/08/2015     ferrous sulfate 325 (65 FE) MG tablet  Take 325 mg by mouth daily with breakfast. Reported on 11/08/2015     fluticasone 50 MCG/ACT nasal spray  Commonly known as:  FLONASE  Place 2 sprays into both nostrils daily.     levothyroxine 150 MCG tablet  Commonly known as:  SYNTHROID, LEVOTHROID  Take 1 tablet (150 mcg total) by mouth daily before breakfast.     lisinopril 10 MG tablet  Commonly known as:  PRINIVIL,ZESTRIL  Take 1 tablet (10 mg total) by mouth daily.     LUMIGAN 0.01 % Soln  Generic drug:  bimatoprost  Apply 1 drop to eye at bedtime.     SUMAtriptan 100 MG tablet  Commonly known as:  IMITREX  1 po at onset of migraine , may repeat in 2hours . Not to exceed 2 pills in 24hrs.     vitamin C 100 MG tablet  Take 100 mg by mouth.       ROS: Negative, with the exception of above mentioned in HPI  Objective:  BP 133/70 mmHg  Pulse 60  Temp(Src) 97.8 F (36.6 C)  Resp 20  Wt 246 lb (111.585 kg)  SpO2 99%  LMP 09/18/2015 Body mass index is 41.59 kg/(m^2). Gen: Afebrile. No acute distress. Nontoxic in appearance, well developed, well nourished obese female. Pleasant. HENT: AT. [place].  MMM, no oral lesions.  Eyes:Pupils Equal Round Reactive to light, Extraocular movements intact,  Conjunctiva without redness, discharge or icterus. MSK: No erythema, no soft tissue swelling, No bruising. FROM abduction, with discomfort last 20 degrees. Pain to resisted pronation forearm. Empty can +, hawkins +, Yergason's +, o'briens negative, Lift off +. Neurovascularly intact distally.   Assessment/Plan: SHAMONE WINZER is a 50 y.o. female present for acute OV for  1. Biceps tendonitis on right - TTP bicep tendon.  - predniSONE (DELTASONE) 20 MG tablet; 60 mg x3d, 40 mg x3d, 20 mg x2d, 10 mg x2d  Dispense: 18 tablet; Refill: 0 - diclofenac (VOLTAREN) 75 MG EC tablet; Take 1 tablet (75 mg total) by mouth 2 (two) times daily.  Dispense: 60 tablet; Refill: 1 - F/U 2 weeks --> consider ortho (Dr. Sherlean Foot) [place], sports medicine.  electronically signed by:  Felix Pacini, DO  [facility] - [facility]

## 2015-11-08 NOTE — Patient Instructions (Signed)
Biceps Tendon Tendinitis (Distal) With Rehab Tendinitis involves inflammation and pain over the affected tendon. The distal biceps tendon (near the elbow) is vulnerable to tendinitis. Distal biceps tendonitis is usually due to the bony bump near the elbow (bicipital tuberosity) causing increased friction over the tendon. The biceps tendon attaches the biceps muscle to one bone in the elbow and two in the shoulder. It is important for proper function of the elbow and for turning the palm upward (supination). SYMPTOMS   Pain, aching, tenderness, and sometimes warmth or redness over the front of the elbow.  Pain when bending the elbow or turning the palm up, using the wrist, especially if performed against resistance.  Crackling sound (crepitation) when the tendon or elbow is moved or touched. CAUSES  The symptoms of biceps tendonitis are due to inflammation of the tendon. Inflammation may be caused by:  Strain from sudden increase in amount or intensity of activity.  Direct blow or injury to the elbow (uncommon).  Overuse or repetitive elbow bending or wrist rotation, particularly when turning the palm up, or with elbow hyperextension. RISK INCREASES WITH:  Sports that involve contact or overhead arm activity (throwing sports, gymnastics, weightlifting, bodybuilding, rock climbing).  Heavy labor.  Poor strength and flexibility.  Failure to warm up properly before activity.  Injury to other structures of the elbow.  Restraint of the elbow. PREVENTION  Warm up and stretch properly before activity.  Allow time for recovery between activities.  Maintain physical fitness:  Strength, flexibility, and endurance.  Cardiovascular fitness.  Learn and use proper exercise technique. PROGNOSIS  With proper treatment, biceps tendon tendonitis is usually curable within 6 weeks.  RELATED COMPLICATIONS   Longer healing time if not properly treated or if not given enough time to  heal.  Chronically inflamed tendon that causes persistent pain with activity, that may progress to constant pain and potentially rupture of the tendon.  Recurring symptoms, especially if activity is resumed too soon, with overuse or with poor technique. TREATMENT  Treatment first involves ice and medicine to reduce pain and inflammation. Modify activities that cause pain, to reduce the chances of causing the condition to get worse. Strengthening and stretching exercises should be performed to promote proper use of the muscles of the elbow. These exercise may be performed at home or with a therapist. Other treatments may be given such as ultrasound or heat therapy. Surgery is usually not recommended.  MEDICATION  If pain medicine is needed, nonsteroidal anti-inflammatory medicines (aspirin and ibuprofen), or other minor pain relievers (acetaminophen), are often advised.  Do not take pain relieving medication for 7 days before surgery.  Prescription pain relievers may be given if your caregiver thinks they are needed. Use only as directed and only as much as you need. HEAT AND COLD:   Cold treatment (icing) should be applied for 10 to 15 minutes every 2 to 3 hours for inflammation and pain, and immediately after activity that aggravates your symptoms. Use ice packs or an ice massage.  Heat treatment may be used before performing stretching and strengthening activities prescribed by your caregiver, physical therapist, or athletic trainer. Use a heat pack or a warm water soak. SEEK MEDICAL CARE IF:   Symptoms get worse or do not improve in 2 weeks, despite treatment.  New, unexplained symptoms develop. (Drugs used in treatment may produce side effects.) EXERCISES  RANGE OF MOTION (ROM) AND STRETCHING EXERCISES - Biceps Tendon Tendinitis (Distal) These exercises may help you when beginning to   rehabilitate your injury. Your symptoms may go away with or without further involvement from your  physician, physical therapist, or athletic trainer. While completing these exercises, remember:   Restoring tissue flexibility helps normal motion to return to the joints. This allows healthier, less painful movement and activity.  An effective stretch should be held for at least 30 seconds.  A stretch should never be painful. You should only feel a gentle lengthening or release in the stretched tissue. STRETCH - Elbow Flexors   Lie on a firm bed or countertop on your back. Be sure that you are in a comfortable position which will allow you to relax your arm muscles.  Place a folded towel under your right / left upper arm, so that your elbow and shoulder are at the same height. Extend your arm; your elbow should not rest on the bed or towel.  Allow the weight of your hand to straighten your elbow. Keep your arm and chest muscles relaxed. Your caretaker may ask you to increase the intensity of your stretch by adding a small wrist or hand weight.  Hold for __________ seconds. You should feel a stretch on the inside of your elbow. Slowly return to the starting position. Repeat __________ times. Complete this exercise __________ times per day. RANGE OF MOTION - Supination, Active   Stand or sit with your elbows at your side. Bend your right / left elbow to 90 degrees.  Turn your palm upward until you feel a gentle stretch on the inside of your forearm.  Hold this position for __________ seconds. Slowly release and return to the starting position. Repeat __________ times. Complete this stretch __________ times per day.  RANGE OF MOTION - Pronation, Active   Stand or sit with your elbows at your side. Bend your right / left elbow to 90 degrees.  Turn your palm downward until you feel a gentle stretch on the top of your forearm.  Hold this position for __________ seconds. Slowly release and return to the starting position. Repeat __________ times. Complete this stretch __________ times per  day.  STRENGTHENING EXERCISES - Biceps Tendon Tendinitis (Distal) These exercises may help you when beginning to rehabilitate your injury. They may resolve your symptoms with or without further involvement from your physician, physical therapist or athletic trainer. While completing these exercises, remember:   Muscles can gain both the endurance and the strength needed for everyday activities through controlled exercises.  Complete these exercises as instructed by your physician, physical therapist or athletic trainer. Increase the resistance and repetitions only as guided.  You may experience muscle soreness or fatigue, but the pain or discomfort you are trying to eliminate should never get worse during these exercises. If this pain does get worse, stop and make sure you are following the directions exactly. If the pain is still present after adjustments, discontinue the exercise until you can discuss the trouble with your clinician. STRENGTH - Elbow Flexors, Isometric   Stand or sit upright on a firm surface. Place your right / left arm so that your hand is palm-up and at the height of your waist.  Place your opposite hand on top of your forearm. Gently push down as your right / left arm resists. Push as hard as you can with both arms without causing any pain or movement at your right / left elbow. Hold this stationary position for __________ seconds.  Gradually release the tension in both arms. Allow your muscles to relax completely before repeating. Repeat   __________ times. Complete this exercise __________ times per day. STRENGTH - Forearm Supinators   Sit with your right / left forearm supported on a table, keeping your elbow below shoulder height. Rest your hand over the edge, palm down.  Gently grip a hammer or a soup ladle.  Without moving your elbow, slowly turn your palm and hand upward to a "thumbs-up" position.  Hold this position for __________ seconds. Slowly return to the  starting position. Repeat __________ times. Complete this exercise __________ times per day.  STRENGTH - Forearm Pronators   Sit with your right / left forearm supported on a table, keeping your elbow below shoulder height. Rest your hand over the edge, palm up.  Gently grip a hammer or a soup ladle.  Without moving your elbow, slowly turn your palm and hand upward to a "thumbs-up" position.  Hold this position for __________ seconds. Slowly return to the starting position. Repeat __________ times. Complete this exercise __________ times per day.  STRENGTH - Elbow Flexors, Supinated  With good posture, stand or sit on a firm chair without armrests. Allow your right / left arm to rest at your side with your palm facing forward.  Holding a __________ weight, or gripping a rubber exercise band or tubing, bring your hand toward your shoulder.  Allow your muscles to control the resistance as your hand returns to your side. Repeat __________ times. Complete this exercise __________ times per day.  STRENGTH - Elbow Flexors, Neutral  With good posture, stand or sit on a firm chair without armrests. Allow your right / left arm to rest at your side with your thumb facing forward.  Holding a __________ weight, or gripping a rubber exercise band or tubing, bring your hand toward your shoulder.  Allow your muscles to control the resistance as your hand returns to your side. Repeat __________ times. Complete this exercise __________ times per day.    This information is not intended to replace advice given to you by your health care provider. Make sure you discuss any questions you have with your health care provider.   Document Released: 08/13/2005 Document Revised: 09/03/2014 Document Reviewed: 11/25/2008 Elsevier Interactive Patient Education 2016 [employer]  

## 2015-11-09 ENCOUNTER — Ambulatory Visit: Payer: 59

## 2015-11-11 ENCOUNTER — Ambulatory Visit: Payer: 59

## 2015-11-22 ENCOUNTER — Ambulatory Visit: Payer: 59 | Admitting: Family Medicine

## 2015-12-02 ENCOUNTER — Other Ambulatory Visit (INDEPENDENT_AMBULATORY_CARE_PROVIDER_SITE_OTHER): Payer: 59

## 2015-12-02 ENCOUNTER — Ambulatory Visit
Admission: RE | Admit: 2015-12-02 | Discharge: 2015-12-02 | Disposition: A | Discharge status: Home / Self Care | Payer: 59 | Source: Ambulatory Visit | Attending: Family Medicine | Admitting: Family Medicine

## 2015-12-02 DIAGNOSIS — E039 Hypothyroidism, unspecified: Secondary | ICD-10-CM

## 2015-12-02 DIAGNOSIS — Z1239 Encounter for other screening for malignant neoplasm of breast: Secondary | ICD-10-CM

## 2015-12-02 LAB — TSH: TSH: 4.57 u[IU]/mL — ABNORMAL HIGH (ref 0.35–4.50)

## 2015-12-05 ENCOUNTER — Telehealth: Payer: Self-pay | Admitting: Family Medicine

## 2015-12-05 NOTE — Telephone Encounter (Signed)
Please call patient: - Her thyroid level is normal. -

## 2015-12-06 ENCOUNTER — Telehealth: Payer: Self-pay | Admitting: *Deleted

## 2015-12-06 DIAGNOSIS — Z1239 Encounter for other screening for malignant neoplasm of breast: Secondary | ICD-10-CM

## 2015-12-06 MED ORDER — LEVOTHYROXINE SODIUM 150 MCG PO TABS: 150.0000 ug | ORAL_TABLET | Freq: Every day | ORAL | Status: DC

## 2015-12-06 NOTE — Telephone Encounter (Signed)
Left message for patient to return call to review lab results. 

## 2015-12-06 NOTE — Telephone Encounter (Signed)
Spoke with patient reviewed lab results. 

## 2015-12-06 NOTE — Telephone Encounter (Signed)
Spoke with patient she states the breast center would not do her mammogram because she told them she had a tender spot on her breast and the order was for screening mammogram and they stated it had to be diagnostic she states she wants a screening mammogram done and is asking if we could schedule it at [place]. Please advise

## 2015-12-09 ENCOUNTER — Other Ambulatory Visit: Payer: 59

## 2016-01-02 ENCOUNTER — Ambulatory Visit (INDEPENDENT_AMBULATORY_CARE_PROVIDER_SITE_OTHER): Payer: 59 | Admitting: Family Medicine

## 2016-01-02 ENCOUNTER — Encounter: Payer: Self-pay | Admitting: Family Medicine

## 2016-01-02 VITALS — BP 137/75 | HR 60 | Temp 98.3°F | Resp 20 | Wt 254.5 lb

## 2016-01-02 DIAGNOSIS — M25552 Pain in left hip: Secondary | ICD-10-CM | POA: Diagnosis not present

## 2016-01-02 MED ORDER — LISINOPRIL 10 MG PO TABS: 10.0000 mg | ORAL_TABLET | Freq: Every day | ORAL | Status: DC

## 2016-01-02 NOTE — Progress Notes (Signed)
Patient ID: Maria Boone, female   DOB: Jul 09, 1966, 50 y.o.   MRN: [id number]    Maria Boone , [birth year], 50 y.o., female MRN: [id number]  CC: left hip pain Subjective: Pt presents for an acute OV with complaints of left hip pain approximately 4 weeks in duration, worsening over the last week. Patient states she had a similar pain approximately 2 years ago with a normal x-Maria. At that time they thought it was a bursitis and she had an injection. She feels currently her pain is worse at night and first thing in the morning. She points to the left greater trochanter/lateral thigh as location of pain. She states this pain does not radiate frequently, but sometimes has radiated to the inner thigh. Patient has continued to take her diclofenac attempts to improve the pain.  Allergies  Allergen Reactions  . Penicillins Other (See Comments)    ORAL BLISTERS/SORES.  Marland Kitchen Amoxicillin   . Percocet [Oxycodone-Acetaminophen] Itching    Nausea also  . Nitrofurantoin Monohyd Macro Rash   Social History  Substance Use Topics  . Smoking status: Never Smoker   . Smokeless tobacco: Never Used  . Alcohol Use: No   Past Medical History  Diagnosis Date  . Migraine   . Obese   . Hypertension   . Thyroid disease    Past Surgical History  Procedure Laterality Date  . Childbirth      x2  . Knee surgery Right   . Cervical biopsy  w/ loop electrode excision     Family History  Problem Relation Age of Onset  . Hypertension Other   . Kidney disease Other   . Lymphoma Other   . Alzheimer's disease Mother   . Cancer Father     lymphoma, exposed to uranium  . Kidney disease Father   . Cancer Paternal Uncle     lymphoma, exposed to uranium     Medication List       This list is accurate as of: 01/02/16  4:17 PM.  Always use your most recent med list.               amitriptyline 25 MG tablet  Commonly known as:  ELAVIL  Take 1 tablet (25 mg total) by mouth at bedtime.     diclofenac 75 MG EC tablet  Commonly known as:  VOLTAREN  Take 1 tablet (75 mg total) by mouth 2 (two) times daily.     ferrous sulfate 325 (65 FE) MG tablet  Take 325 mg by mouth daily with breakfast. Reported on 01/02/2016     fluticasone 50 MCG/ACT nasal spray  Commonly known as:  FLONASE  Place 2 sprays into both nostrils daily.     levothyroxine 150 MCG tablet  Commonly known as:  SYNTHROID, LEVOTHROID  Take 1 tablet (150 mcg total) by mouth daily before breakfast.     lisinopril 10 MG tablet  Commonly known as:  PRINIVIL,ZESTRIL  Take 1 tablet (10 mg total) by mouth daily.     LUMIGAN 0.01 % Soln  Generic drug:  bimatoprost  Apply 1 drop to eye at bedtime.     SUMAtriptan 100 MG tablet  Commonly known as:  IMITREX  1 po at onset of migraine , may repeat in 2hours . Not to exceed 2 pills in 24hrs.     vitamin C 100 MG tablet  Take 100 mg by mouth. Reported on 01/02/2016         ROS: Negative, with  the exception of above mentioned in HPI  Objective:  BP 137/75 mmHg  Pulse 60  Temp(Src) 98.3 F (36.8 C)  Resp 20  Wt 254 lb 8 oz (115.44 kg)  SpO2 99% Body mass index is 43.03 kg/(m^2). Gen: Afebrile. No acute distress. Nontoxic in appearance, well-developed, well-nourished, obese, Caucasian female. Very pleasant. HENT: AT. [place]. MMM, no oral lesions.  Eyes:Pupils Equal Round Reactive to light, Extraocular movements intact,  Conjunctiva without redness, discharge or icterus. CV: RRR  MSK: No erythema, no bruising. Tender to palpation left greater trochanter. No spinal/SI bony tenderness. Normal range of motion left hip and lumbar spine. Neurovascular intact distally. Skin: No rashes, purpura or petechiae. No skin break. Neuro: Normal gait. PERLA. EOMi. Alert. Oriented x3Muscle strength 5/5 bilateral lower extremity. DTRs equal bilaterally.  Assessment/Plan: Maria Boone is a 50 y.o. female present for acute OV for  1. Left hip pain - Patient has signs and  symptoms of left hip bursitis on exam today. Discussed with her different interventions to help with discomfort including NSAIDs. Patient encouraged to use naproxen twice a day when necessary. Patient also voiced desire to be seen by a prior orthopedic, Dr. Theressa Millard to have injection. -AVS on bursitis and home therapy was provided to patient today. - AMB referral to orthopedics - Follow-up when necessary  > 25 minutes spent with patient, >50% of time spent face to face counseling patient and coordinating care.  electronically signed by:  Felix Pacini, DO  [facility] - [place]

## 2016-01-02 NOTE — Patient Instructions (Signed)
Referred to your orthopedic of choice.   Hip Bursitis Bursitis is a swelling and soreness (inflammation) of a fluid-filled sac (bursa). This sac overlies and protects the joints.  CAUSES   Injury.  Overuse of the muscles surrounding the joint.  Arthritis.  Gout.  Infection.  Cold weather.  Inadequate warm-up and conditioning prior to activities. The cause may not be known.  SYMPTOMS   Mild to severe irritation.  Tenderness and swelling over the outside of the hip.  Pain with motion of the hip.  If the bursa becomes infected, a fever may be present. Redness, tenderness, and warmth will develop over the hip. Symptoms usually lessen in 3 to 4 weeks with treatment, but can come back. TREATMENT If conservative treatment does not work, your caregiver may advise draining the bursa and injecting cortisone into the area. This may speed up the healing process. This may also be used as an initial treatment of choice. HOME CARE INSTRUCTIONS   Apply ice to the affected area for 15-20 minutes every 3 to 4 hours while awake for the first 2 days. Put the ice in a plastic bag and place a towel between the bag of ice and your skin.  Rest the painful joint as much as possible, but continue to put the joint through a normal range of motion at least 4 times per day. When the pain lessens, begin normal, slow movements and usual activities to help prevent stiffness of the hip.  Only take over-the-counter or prescription medicines for pain, discomfort, or fever as directed by your caregiver.  Use crutches to limit weight bearing on the hip joint, if advised.  Elevate your painful hip to reduce swelling. Use pillows for propping and cushioning your legs and hips.  Gentle massage may provide comfort and decrease swelling. SEEK IMMEDIATE MEDICAL CARE IF:   Your pain increases even during treatment, or you are not improving.  You have a fever.  You have heat and inflammation over the involved  bursa.  You have any other questions or concerns. MAKE SURE YOU:   Understand these instructions.  Will watch your condition.  Will get help right away if you are not doing well or get worse.   This information is not intended to replace advice given to you by your health care provider. Make sure you discuss any questions you have with your health care provider.   Document Released: 02/02/2002 Document Revised: 11/05/2011 Document Reviewed: 03/15/2015 Elsevier Interactive Patient Education [employer]

## 2016-02-22 ENCOUNTER — Ambulatory Visit (INDEPENDENT_AMBULATORY_CARE_PROVIDER_SITE_OTHER): Payer: 59 | Admitting: Family Medicine

## 2016-02-22 ENCOUNTER — Encounter: Payer: Self-pay | Admitting: Family Medicine

## 2016-02-22 VITALS — BP 115/73 | HR 64 | Temp 98.0°F | Resp 20 | Ht 65.0 in | Wt 253.2 lb

## 2016-02-22 DIAGNOSIS — M79646 Pain in unspecified finger(s): Secondary | ICD-10-CM | POA: Insufficient documentation

## 2016-02-22 DIAGNOSIS — M79644 Pain in right finger(s): Secondary | ICD-10-CM | POA: Diagnosis not present

## 2016-02-22 DIAGNOSIS — I10 Essential (primary) hypertension: Secondary | ICD-10-CM | POA: Diagnosis not present

## 2016-02-22 DIAGNOSIS — E039 Hypothyroidism, unspecified: Secondary | ICD-10-CM | POA: Diagnosis not present

## 2016-02-22 NOTE — Patient Instructions (Signed)
We will recheck your thyroid panel  and call you once available.

## 2016-02-22 NOTE — Progress Notes (Signed)
Patient ID: Maria Boone, female   DOB: 09/26/1965, 50 y.o.   MRN: [id number]    Maria Boone , 04/13/1966, 50 y.o., female MRN: [id number]  Subjective:  Hypothyroidism: She is currently taking 150 mcg M-F (old regimen by prior provider). She feels more tired, experiencing hair loss. Her dose was altered last about 6 months ago adn retested in April. April resutls were basically normal range. She would like to go to a daily regimen if able.   Right thumb pain: pt is a right handed female. She states she has noticed swelling and discoloration of her right thumb. She states it is mildly tender, but more annoying.    Hypertension:  Patient presents for follow up on her HTn. She states she does not have HTn. She is on lisinopril 10 mg daily and reports compliance. She does not routinely exercise or watch her diet.  She denies chest pain, LE edema, shortness of breath or syncope.   Allergies  Allergen Reactions  . Penicillins Other (See Comments)    ORAL BLISTERS/SORES.  Marland Kitchen Amoxicillin   . Percocet [Oxycodone-Acetaminophen] Itching    Nausea also  . Nitrofurantoin Monohyd Macro Rash   Social History  Substance Use Topics  . Smoking status: Never Smoker   . Smokeless tobacco: Never Used  . Alcohol Use: No   Past Medical History  Diagnosis Date  . Migraine   . Obese   . Hypertension   . Thyroid disease    Past Surgical History  Procedure Laterality Date  . Childbirth      x2  . Knee surgery Right   . Cervical biopsy  w/ loop electrode excision     Family History  Problem Relation Age of Onset  . Hypertension Other   . Kidney disease Other   . Lymphoma Other   . Alzheimer's disease Mother   . Cancer Father     lymphoma, exposed to uranium  . Kidney disease Father   . Cancer Paternal Uncle     lymphoma, exposed to uranium     Medication List       This list is accurate as of: 02/22/16  3:57 PM.  Always use your most recent med list.               amitriptyline 25 MG tablet  Commonly known as:  ELAVIL  Take 1 tablet (25 mg total) by mouth at bedtime.     diclofenac 75 MG EC tablet  Commonly known as:  VOLTAREN  Take 1 tablet (75 mg total) by mouth 2 (two) times daily.     ferrous sulfate 325 (65 FE) MG tablet  Take 325 mg by mouth daily with breakfast. Reported on 02/22/2016     fluticasone 50 MCG/ACT nasal spray  Commonly known as:  FLONASE  Place 2 sprays into both nostrils daily.     levothyroxine 150 MCG tablet  Commonly known as:  SYNTHROID, LEVOTHROID  Take 1 tablet (150 mcg total) by mouth daily before breakfast.     lisinopril 10 MG tablet  Commonly known as:  PRINIVIL,ZESTRIL  Take 1 tablet (10 mg total) by mouth daily.     LUMIGAN 0.01 % Soln  Generic drug:  bimatoprost  Apply 1 drop to eye at bedtime.     SUMAtriptan 100 MG tablet  Commonly known as:  IMITREX  1 po at onset of migraine , may repeat in 2hours . Not to exceed 2 pills in 24hrs.  vitamin C 100 MG tablet  Take 100 mg by mouth. Reported on 01/02/2016         ROS: Negative, with the exception of above mentioned in HPI   Objective:  BP 115/73 mmHg  Pulse 64  Temp(Src) 98 F (36.7 C)  Resp 20  Ht 5\' 5"  (1.651 m)  Wt 253 lb 4 oz (114.873 kg)  BMI 42.14 kg/m2  SpO2 99% Body mass index is 42.14 kg/(m^2). Gen: Afebrile. No acute distress. Nontoxic in apprearacne. Well developed, well nourished female.  HENT: AT. [place].MMM, no oral lesions.  Eyes:Pupils Equal Round Reactive to light, Extraocular movements intact,  Conjunctiva without redness, discharge or icterus. CV: RRR  Chest: CTAB, no wheeze or crackles. Good air movement, normal resp effort.  Abd: Soft. NTND. BS pressent Neuro: Normal gait. PERLA. EOMi. Alert. Oriented x3   Assessment/Plan: Maria Boone is a 50 y.o. female present for acute OV for  Hypothyroidism, unspecified hypothyroidism type - will retest today, and attempt to alter dose to 7 day regimen.  - TSH - T4,  free - If altered will need to follow up in 8 weeks to retest.  Essential hypertension - stable.  - continue lisinopril.  - low salt diet, increase exercise.  - F/U q 6 months.   Thumb pain, right - discussed with pt that we could xray the area, she wants to wait until she gets back next month from vacation. I am agreeable to this, did not appear infectious.   electronically signed by:  54, DO  [facility] - [place]

## 2016-02-23 LAB — TSH: TSH: 6.06 u[IU]/mL — AB (ref 0.35–4.50)

## 2016-02-23 LAB — T4, FREE: FREE T4: 0.79 ng/dL (ref 0.60–1.60)

## 2016-02-24 ENCOUNTER — Telehealth: Payer: Self-pay | Admitting: Family Medicine

## 2016-02-24 ENCOUNTER — Ambulatory Visit: Payer: 59 | Admitting: Family Medicine

## 2016-02-24 DIAGNOSIS — E039 Hypothyroidism, unspecified: Secondary | ICD-10-CM

## 2016-02-24 MED ORDER — LEVOTHYROXINE SODIUM 125 MCG PO TABS: 125.0000 ug | ORAL_TABLET | Freq: Every day | ORAL | Status: DC

## 2016-02-24 NOTE — Telephone Encounter (Signed)
Spoke with patient reviewed results and dosing instructions for levothyroxine.Patient verbalized understanding.

## 2016-02-24 NOTE — Telephone Encounter (Signed)
Please call Maria Boone. Her tsh is just a little off. I believe if she takes the levothyroxine 7 days a week (was taking 5 days a week) at 125 mcg daily it should equal out. I have called in the lower dose, for her to take 7 days a week.  - if she asks: even though it is a lower dose she is still actually getting just a little more medicine weekly since taking 7 days.  - refills called in.  - retest in 10 weeks with lab appt only (ordered)

## 2016-05-28 ENCOUNTER — Telehealth: Payer: Self-pay | Admitting: Family Medicine

## 2016-05-28 ENCOUNTER — Encounter: Payer: Self-pay | Admitting: Family Medicine

## 2016-05-28 ENCOUNTER — Other Ambulatory Visit: Payer: 59

## 2016-05-28 ENCOUNTER — Other Ambulatory Visit (INDEPENDENT_AMBULATORY_CARE_PROVIDER_SITE_OTHER): Payer: 59

## 2016-05-28 DIAGNOSIS — E039 Hypothyroidism, unspecified: Secondary | ICD-10-CM | POA: Diagnosis not present

## 2016-05-28 LAB — TSH: TSH: 4.95 u[IU]/mL — AB (ref 0.35–4.50)

## 2016-05-28 LAB — HM MAMMOGRAPHY

## 2016-05-28 NOTE — Telephone Encounter (Signed)
Patient states she is now 50 y.o. and needs to know if she is due to have a bone density scan.

## 2016-05-28 NOTE — Telephone Encounter (Signed)
LMOM for pt to CB.  

## 2016-05-28 NOTE — Telephone Encounter (Signed)
Patient aware.

## 2016-05-28 NOTE — Telephone Encounter (Signed)
Please advise 

## 2016-05-28 NOTE — Telephone Encounter (Signed)
Preventive health maintenance, such as bone density test, are considered and ordered at yearly physicals.  Pt has no identifiable risk factors to indicate early screening, routine screening occurs around 60-65. She should take vit D and calcium supplement to aide in bone health, but she doe snot need a bone density yet. We will review again at her next physical.

## 2016-05-29 ENCOUNTER — Telehealth: Payer: Self-pay | Admitting: Family Medicine

## 2016-05-29 NOTE — Telephone Encounter (Signed)
Please call pt: - her TSH is good. It is just very mildly above goal, but not enough to change the medication. Make sure she is taking on an empty stomach, and no eating to about 26m-1h after taking.

## 2016-05-29 NOTE — Telephone Encounter (Signed)
Spoke with patient reviewed lab results and instructions. 

## 2016-05-29 NOTE — Telephone Encounter (Signed)
Refills prescribed.

## 2016-05-31 ENCOUNTER — Encounter: Payer: Self-pay | Admitting: *Deleted

## 2016-07-10 ENCOUNTER — Telehealth: Payer: Self-pay | Admitting: Family Medicine

## 2016-07-10 NOTE — Telephone Encounter (Signed)
Spoke with patient scheduled appt for evaluation . 

## 2016-07-10 NOTE — Telephone Encounter (Signed)
Patient requesting CB about arthritis symptoms that are spreading to her hands. She would like to know what type of arthritis she has. Is there something that can be done at primary care to lessen her symptoms.

## 2016-07-16 ENCOUNTER — Encounter: Payer: Self-pay | Admitting: Family Medicine

## 2016-07-16 ENCOUNTER — Ambulatory Visit (INDEPENDENT_AMBULATORY_CARE_PROVIDER_SITE_OTHER): Payer: 59 | Admitting: Family Medicine

## 2016-07-16 VITALS — BP 134/70 | Temp 97.9°F | Resp 20 | Wt 253.0 lb

## 2016-07-16 DIAGNOSIS — M25552 Pain in left hip: Secondary | ICD-10-CM

## 2016-07-16 DIAGNOSIS — M2550 Pain in unspecified joint: Secondary | ICD-10-CM | POA: Diagnosis not present

## 2016-07-16 DIAGNOSIS — M79644 Pain in right finger(s): Secondary | ICD-10-CM | POA: Diagnosis not present

## 2016-07-16 DIAGNOSIS — M1711 Unilateral primary osteoarthritis, right knee: Secondary | ICD-10-CM | POA: Diagnosis not present

## 2016-07-16 MED ORDER — MELOXICAM 15 MG PO TABS: 15.0000 mg | ORAL_TABLET | Freq: Every day | ORAL | 11 refills | Status: AC

## 2016-07-16 NOTE — Patient Instructions (Signed)
I have called in mobic for you to take daily with food.  I will call you with lab results once they are received.     Rheumatoid Arthritis Introduction Rheumatoid arthritis (RA) is a long-term (chronic) disease. RA causes inflammation in your joints. Your joints may feel painful, stiff, swollen, warm, or tender. RA may start slowly. Usually, it affects the small joints of the hands and feet. It can also affect other parts of the body, even the heart, eyes, or lungs. Symptoms of RA often come and go. Sometimes, symptoms get worse for a while. These are called flares. There is no cure for RA, but your doctor will work with you to find the best treatment option for you. This will depend on how the disease is changing in your body. Follow these instructions at home:  Take over-the-counter and prescription medicines only as told by your doctor. Your doctor may change (adjust) your medicines every 3 months.  Start an exercise program as told by your doctor.  Rest when you have a flare.  Return to your normal activities as told by your doctor. Ask your doctor what activities are safe for you.  Keep all follow-up visits as told by your doctor. This is important. Contact a doctor if:  You have a flare.  You have a fever.  You have problems (side effects) because of your medicines. Get help right away if:  You have chest pain.  You have trouble breathing.  You have a hot, painful joint all of a sudden, and it is worse than your usual joint aches. This information is not intended to replace advice given to you by your health care provider. Make sure you discuss any questions you have with your health care provider. Document Released: 11/05/2011 Document Revised: 01/19/2016 Document Reviewed: 05/26/2015  2017 Elsevier

## 2016-07-16 NOTE — Progress Notes (Signed)
Maria Boone , [birth year], 50 y.o., female MRN: [id number] Patient Care Team    Relationship Specialty Notifications Start End  Ma Hillock, DO PCP - General Family Medicine  10/10/15   Pennie Rushing, MD Referring Physician Orthopedic Surgery  01/02/16     CC: Joint pain  Subjective: Pt presents for an acute OV with complaints of joint pain of a few months duration.  Associated symptoms include discomfort in multiple joints of her hands, wrist, elbows, left shoulder, neck and knee pain. She has had low back pain as well, and treated at a spine specialist. She Is under evaluation of her knee through orthopedics. Recent xrays through [facility] system reviewed. She knows her father had an arthtitis, but sh eis uncertain on type. She has been tried on Voltaren BID and did not feel it was helpful. She endorses swelling and pain, especially in her left hand and right thumb area.   HIP (left0 01/06/2016: 1.No acute fracture or malalignment.  2.Mild degenerative changes of the bilateral hips, sacroiliac joints, and pubic symphysis. 3.Small focus of mineralization adjacent to the left greater trochanter which may reflect enthesopathy. 4.Small calcific/ossific density projecting lateral to the right acetabulum, question heterotopic ossification versus os acetabuli.  Bilateral knee 05/14/2016: Right knee: 1.No acute fracture or malalignment.  2.Moderate knee effusion. Total knee arthroplasty without osteolysis. Left knee: 1.No acute fracture. Mild lateral patellar tilt.  2.Small knee effusion. Mild tricompartmental degenerative changes.  Lumbar/thoracic 06/25/2016 FINDINGS: 1.No acute fracture. 2.There is approximately 3 mm anterolisthesis of L4 on L5. 3.Mild dextrocurvature centered at L1-L2. 4.Mild multilevel degenerative disc disease. Mild facet arthropathy at L3-L4, L4-L5, and L5-S1. 5.Mild bilateral sacroiliac joint degenerative change 6.Aortic  atherosclerosis.  Allergies  Allergen Reactions  . Penicillins Other (See Comments)    ORAL BLISTERS/SORES.  Marland Kitchen Amoxicillin   . Percocet [Oxycodone-Acetaminophen] Itching    Nausea also  . Nitrofurantoin Monohyd Macro Rash   Social History  Substance Use Topics  . Smoking status: Never Smoker  . Smokeless tobacco: Never Used  . Alcohol use No   Past Medical History:  Diagnosis Date  . Hypertension   . Migraine   . Obese   . Thyroid disease    Past Surgical History:  Procedure Laterality Date  . CERVICAL BIOPSY  W/ LOOP ELECTRODE EXCISION    . childbirth     x2  . KNEE SURGERY Right    Family History  Problem Relation Age of Onset  . Alzheimer's disease Mother   . Cancer Father     lymphoma, exposed to uranium  . Kidney disease Father   . Cancer Paternal Uncle     lymphoma, exposed to uranium  . Hypertension Other   . Kidney disease Other   . Lymphoma Other      Medication List       Accurate as of 07/16/16  3:51 PM. Always use your most recent med list.          amitriptyline 25 MG tablet Commonly known as:  ELAVIL Take 1 tablet (25 mg total) by mouth at bedtime.   diclofenac 75 MG EC tablet Commonly known as:  VOLTAREN Take 1 tablet (75 mg total) by mouth 2 (two) times daily.   diclofenac sodium 1 % Gel Commonly known as:  VOLTAREN Apply dime size ammount 4x a day as needed for left knee pain   ferrous sulfate 325 (65 FE) MG tablet Take 325 mg by mouth daily with breakfast. Reported on 02/22/2016  fluticasone 50 MCG/ACT nasal spray Commonly known as:  FLONASE Place 2 sprays into both nostrils daily.   ibuprofen 200 MG tablet Commonly known as:  ADVIL,MOTRIN Take 200 mg by mouth.   levothyroxine 125 MCG tablet Commonly known as:  SYNTHROID, LEVOTHROID Take 1 tablet (125 mcg total) by mouth daily before breakfast.   lisinopril 10 MG tablet Commonly known as:  PRINIVIL,ZESTRIL Take 1 tablet (10 mg total) by mouth daily.   LUMIGAN 0.01 %  Soln Generic drug:  bimatoprost Apply 1 drop to eye at bedtime.   senna-docusate 8.6-50 MG tablet Commonly known as:  Senokot-S Take by mouth.   SUMAtriptan 100 MG tablet Commonly known as:  IMITREX 1 po at onset of migraine , may repeat in 2hours . Not to exceed 2 pills in 24hrs.   vitamin C 100 MG tablet Take 100 mg by mouth. Reported on 01/02/2016       No results found for this or any previous visit (from the past 24 hour(s)). No results found.   ROS: Negative, with the exception of above mentioned in HPI   Objective:  BP 134/70 (BP Location: Left Arm, Patient Position: Sitting, Cuff Size: Large)   Temp 97.9 F (36.6 C)   Resp 20   Wt 253 lb (114.8 kg)   SpO2 97%   BMI 42.10 kg/m  Body mass index is 42.1 kg/m. Gen: Afebrile. No acute distress. Nontoxic in appearance, well developed, well nourished. Obese, pleasant caucasian female.  HENT: AT. [place]. MMM, no oral lesions.  Eyes:Pupils Equal Round Reactive to light, Extraocular movements intact,  Conjunctiva without redness, discharge or icterus. Skin: no rashes, purpura or petechiae.  Neuro/MSK:Normal gait. PERLA. EOMi. Alert. Oriented x3 Cranial nerves II through XII intact. Mild swelling left 1st proximal metacarpal. TTP multiple joints of hands, wrist and shoulder. Neg tinels at wrist and elbows. Muscle strength 5/5 Upper and lower  extremity. DTRs equal bilaterally. Psych: Normal affect, dress and demeanor. Normal speech. Normal thought content and judgment.  Assessment/Plan: Maria Boone is a 50 y.o. female present for acute OV for  Arthralgia, unspecified joint Osteoarthritis of right knee, unspecified osteoarthritis type Pain of right thumb Left hip pain - discussed with pt her pain could be from autoimmune d/o, RA, inflammatory arthritis or osteoarthritis. Encouraged daily anti-inflammatory with food. Encouraged increase in activity, water aerobics would be easy on her joints.  - Cyclic citrul peptide  antibody, IgG - Rheumatoid Factor - ANA, IFA Comprehensive Panel - meloxicam (MOBIC) 15 MG tablet; Take 1 tablet (15 mg total) by mouth daily.  Dispense: 30 tablet; Refill: 11 - Sed Rate (ESR) - C-reactive protein - F/U dependent on lab results.   electronically signed by:  Howard Pouch, DO  [place]

## 2016-07-17 ENCOUNTER — Telehealth: Payer: Self-pay | Admitting: Family Medicine

## 2016-07-17 ENCOUNTER — Encounter: Payer: Self-pay | Admitting: *Deleted

## 2016-07-17 DIAGNOSIS — M1990 Unspecified osteoarthritis, unspecified site: Secondary | ICD-10-CM | POA: Insufficient documentation

## 2016-07-17 DIAGNOSIS — M0580 Other rheumatoid arthritis with rheumatoid factor of unspecified site: Secondary | ICD-10-CM | POA: Insufficient documentation

## 2016-07-17 LAB — RHEUMATOID FACTOR: RHEUMATOID FACTOR: 109 [IU]/mL — AB (ref <14)

## 2016-07-17 LAB — ANA, IFA COMPREHENSIVE PANEL
ANA: NEGATIVE
ENA SM Ab Ser-aCnc: <1
SCLERODERMA (SCL-70) (ENA) ANTIBODY, IGG: NEGATIVE
SM/RNP: <1
SSA (RO) (ENA) ANTIBODY, IGG: NEGATIVE
SSB (La) (ENA) Antibody, IgG: <1
ds DNA Ab: 1 IU/mL

## 2016-07-17 LAB — SEDIMENTATION RATE: SED RATE: 28 mm/h — AB (ref 0–20)

## 2016-07-17 LAB — C-REACTIVE PROTEIN: CRP: 28.2 mg/L — ABNORMAL HIGH (ref <8.0)

## 2016-07-17 LAB — CYCLIC CITRUL PEPTIDE ANTIBODY, IGG

## 2016-07-17 NOTE — Telephone Encounter (Signed)
Please call pt: - I attempted to call today to discuss results of her labs with her, but was unable to get a hold of her.  - please inform her that her labs do appear consistent for rheumatoid arthritis with elevated inflammatory markers, positive CCP and rheumatoid factor positive. I have placed a referral to rheumatology for her for further evaluation and treatment options.

## 2016-07-17 NOTE — Telephone Encounter (Signed)
Tried to call patient no answer at home number, mobile number goes to voice mail but mailbox is full unable to leave message. Sent message with detailed information and results in My Chart.

## 2016-07-18 NOTE — Telephone Encounter (Signed)
Spoke with patient husband reviewed information and instructions as per DPR.

## 2016-08-22 ENCOUNTER — Encounter: Payer: Self-pay | Admitting: Family Medicine

## 2016-08-22 DIAGNOSIS — M069 Rheumatoid arthritis, unspecified: Secondary | ICD-10-CM | POA: Insufficient documentation

## 2016-08-25 ENCOUNTER — Other Ambulatory Visit: Payer: Self-pay | Admitting: Family Medicine

## 2016-11-13 ENCOUNTER — Telehealth: Payer: Self-pay | Admitting: Family Medicine

## 2016-11-13 NOTE — Telephone Encounter (Signed)
Patient is requesting a later appointment for a physical. Patient ideally would like April 19th in the morning if possible (Dr. Alan Ripper schedule is not set up to accept physicals that morning, I advised patient of this.) Patient would like a call back from CMA who can speak with Dr. Claiborne Billings to see if she would approve a later in the afternoon appointment or an earlier appointment on the 19th of April.   Please call patient to advise. Patient said it does not have to be immediate.

## 2016-11-13 NOTE — Telephone Encounter (Signed)
Tried to call patient no answer. Patient is requesting a Thursday appt. Dr Claiborne Billings not available on Thursday afternoons Patient can be scheduled in a NP slot for her CPE.

## 2016-11-15 NOTE — Telephone Encounter (Signed)
Patient is scheduled for Friday April 13th at 2pm (due to patient schedule) in a new patient slot per Rosalita Chessman.

## 2016-11-15 NOTE — Telephone Encounter (Signed)
Left message for patient to return call to schedule her appt.

## 2016-12-07 ENCOUNTER — Encounter: Payer: Self-pay | Admitting: Family Medicine

## 2016-12-07 ENCOUNTER — Ambulatory Visit (INDEPENDENT_AMBULATORY_CARE_PROVIDER_SITE_OTHER): Payer: 59 | Admitting: Family Medicine

## 2016-12-07 VITALS — BP 132/80 | HR 57 | Temp 97.9°F | Resp 20 | Ht 65.0 in | Wt 254.5 lb

## 2016-12-07 DIAGNOSIS — I10 Essential (primary) hypertension: Secondary | ICD-10-CM

## 2016-12-07 DIAGNOSIS — Z131 Encounter for screening for diabetes mellitus: Secondary | ICD-10-CM

## 2016-12-07 DIAGNOSIS — E039 Hypothyroidism, unspecified: Secondary | ICD-10-CM | POA: Diagnosis not present

## 2016-12-07 DIAGNOSIS — Z1211 Encounter for screening for malignant neoplasm of colon: Secondary | ICD-10-CM

## 2016-12-07 DIAGNOSIS — Z0001 Encounter for general adult medical examination with abnormal findings: Secondary | ICD-10-CM

## 2016-12-07 DIAGNOSIS — Z79899 Other long term (current) drug therapy: Secondary | ICD-10-CM

## 2016-12-07 DIAGNOSIS — Z6841 Body Mass Index (BMI) 40.0 and over, adult: Secondary | ICD-10-CM | POA: Insufficient documentation

## 2016-12-07 LAB — COMPLETE METABOLIC PANEL WITH GFR
ALT: 8 U/L (ref 6–29)
AST: 12 U/L (ref 10–35)
Albumin: 3.7 g/dL (ref 3.6–5.1)
Alkaline Phosphatase: 75 U/L (ref 33–130)
BILIRUBIN TOTAL: 0.5 mg/dL (ref 0.2–1.2)
BUN: 13 mg/dL (ref 7–25)
CHLORIDE: 105 mmol/L (ref 98–110)
CO2: 26 mmol/L (ref 20–31)
CREATININE: 0.94 mg/dL (ref 0.50–1.05)
Calcium: 9.1 mg/dL (ref 8.6–10.4)
GFR, EST AFRICAN AMERICAN: 82 mL/min (ref >=60)
GFR, Est Non African American: 71 mL/min (ref >=60)
GLUCOSE: 83 mg/dL (ref 65–99)
Potassium: 4.2 mmol/L (ref 3.5–5.3)
SODIUM: 139 mmol/L (ref 135–146)
TOTAL PROTEIN: 6.3 g/dL (ref 6.1–8.1)

## 2016-12-07 LAB — CBC WITH DIFFERENTIAL/PLATELET
Basophils Absolute: 0 cells/uL (ref 0–200)
Basophils Relative: 0 %
EOS ABS: 73 {cells}/uL (ref 15–500)
Eosinophils Relative: 1 %
HEMATOCRIT: 39.2 % (ref 35.0–45.0)
HEMOGLOBIN: 13.2 g/dL (ref 11.7–15.5)
LYMPHS ABS: 1971 {cells}/uL (ref 850–3900)
LYMPHS PCT: 27 %
MCH: 34.1 pg — AB (ref 27.0–33.0)
MCHC: 33.7 g/dL (ref 32.0–36.0)
MCV: 101.3 fL — AB (ref 80.0–100.0)
MONO ABS: 511 {cells}/uL (ref 200–950)
MPV: 10.2 fL (ref 7.5–12.5)
Monocytes Relative: 7 %
NEUTROS PCT: 65 %
Neutro Abs: 4745 cells/uL (ref 1500–7800)
Platelets: 297 10*3/uL (ref 140–400)
RBC: 3.87 MIL/uL (ref 3.80–5.10)
RDW: 14.1 % (ref 11.0–15.0)
WBC: 7.3 10*3/uL (ref 3.8–10.8)

## 2016-12-07 LAB — LIPID PANEL
Cholesterol: 172 mg/dL (ref <200)
HDL: 46 mg/dL — ABNORMAL LOW (ref >50)
LDL CALC: 101 mg/dL — AB (ref <100)
TRIGLYCERIDES: 125 mg/dL (ref <150)
Total CHOL/HDL Ratio: 3.7 Ratio (ref <5.0)
VLDL: 25 mg/dL (ref <30)

## 2016-12-07 LAB — TSH: TSH: 0.25 mIU/L — ABNORMAL LOW

## 2016-12-07 MED ORDER — LISINOPRIL 10 MG PO TABS: 10.0000 mg | ORAL_TABLET | Freq: Every day | ORAL | 1 refills | Status: DC

## 2016-12-07 NOTE — Progress Notes (Signed)
Patient ID: Maria Boone, female  DOB: [birth year], 51 y.o.   MRN: [id number] Patient Care Team    Relationship Specialty Notifications Start End  Natalia Leatherwood, DO PCP - General Family Medicine  10/10/15   Kenna Gilbert, MD Referring Physician Orthopedic Surgery  01/02/16     Subjective:  Maria Boone is a 51 y.o.  Female  present for CPE. All past medical history, surgical history, allergies, family history, immunizations, medications and social history were updated in the electronic medical record today. All recent labs, ED visits and hospitalizations within the last year were reviewed.  No complaints today. Started om MTX and intermittent steroids for RA this year, through rheumatology.   Health maintenance:  Colonoscopy: no fhx. Due for screening this year, pt agreeable to referral for GI.  Mammogram: completed:05/2016, birads 1. Yearly. Has completed at [facility] on [address],  Cervical cancer screening: last pap: 09/09/2015, results: normal, HPV negative, completed by: Dr. Claiborne Billings Immunizations: tdap utd 2013, Influenza 2012 (encouraged yearly) Infectious disease screening: HIV declined DEXA: N/A Assistive device: none Oxygen OJJ:[id number] Patient has a Dental home. Hospitalizations/ED visits: none  Depression screen [facility] 2/9 12/07/2016  Decreased Interest 0  Down, Depressed, Hopeless 0  PHQ - 2 Score 0    Current Exercise Habits: The patient does not participate in regular exercise at present Exercise limited by: None identified   Immunization History  Administered Date(s) Administered  . Influenza Split 06/28/2011  . Influenza Whole 06/07/2010  . Td 08/27/2006     Past Medical History:  Diagnosis Date  . Arthritis   . Hypertension   . Migraine   . Obese   . Thyroid disease    Allergies  Allergen Reactions  . Penicillins Other (See Comments)    ORAL BLISTERS/SORES.  Marland Kitchen Amoxicillin   . Percocet [Oxycodone-Acetaminophen] Itching    Nausea also  .  Nitrofurantoin Monohyd Macro Rash   Past Surgical History:  Procedure Laterality Date  . CERVICAL BIOPSY  W/ LOOP ELECTRODE EXCISION    . childbirth     x2  . KNEE SURGERY Right    Family History  Problem Relation Age of Onset  . Alzheimer's disease Mother   . Cancer Father     lymphoma, exposed to uranium  . Kidney disease Father   . Cancer Paternal Uncle     lymphoma, exposed to uranium  . Hypertension Other   . Kidney disease Other   . Lymphoma Other    Social History   Social History  . Marital status: Married    Spouse name: N/A  . Number of children: N/A  . Years of education: N/A   Occupational History  . Not on file.   Social History Main Topics  . Smoking status: Never Smoker  . Smokeless tobacco: Never Used  . Alcohol use No  . Drug use: No  . Sexual activity: Yes   Other Topics Concern  . Not on file   Social History Narrative   Married. William. Children 2.   Works for [employer].    Wears her seatbelt. Smoke alarm in the home. Firearms in the home (in locked cabinet).   Feels safe in her relationships.    Allergies as of 12/07/2016      Reactions   Penicillins Other (See Comments)   ORAL BLISTERS/SORES.   Amoxicillin    Percocet [oxycodone-acetaminophen] Itching   Nausea also   Nitrofurantoin Monohyd Macro Rash  Medication List       Accurate as of 12/07/16  2:49 PM. Always use your most recent med list.          amitriptyline 25 MG tablet Commonly known as:  ELAVIL Take 1 tablet (25 mg total) by mouth at bedtime.   Calcium 600-200 MG-UNIT tablet Take 1 tablet by mouth daily.   diclofenac sodium 1 % Gel Commonly known as:  VOLTAREN Apply dime size ammount 4x a day as needed for left knee pain   ferrous sulfate 325 (65 FE) MG tablet Take 325 mg by mouth daily with breakfast. Reported on 02/22/2016   fluticasone 50 MCG/ACT nasal spray Commonly known as:  FLONASE Place 2 sprays into both nostrils daily.   folic  acid 1 MG tablet Commonly known as:  FOLVITE Take 1 mg by mouth daily.   levothyroxine 125 MCG tablet Commonly known as:  SYNTHROID, LEVOTHROID TAKE ONE TABLET BY MOUTH ONCE DAILY BEFORE BREAKFAST   lisinopril 10 MG tablet Commonly known as:  PRINIVIL,ZESTRIL Take 1 tablet (10 mg total) by mouth daily.   LUMIGAN 0.01 % Soln Generic drug:  bimatoprost Apply 1 drop to eye at bedtime.   meloxicam 15 MG tablet Commonly known as:  MOBIC Take 1 tablet (15 mg total) by mouth daily.   methotrexate 1 g injection Commonly known as:  50 mg/ml Inject 0.6 mg/m2 into the vein once.   senna-docusate 8.6-50 MG tablet Commonly known as:  Senokot-S Take by mouth.   SUMAtriptan 100 MG tablet Commonly known as:  IMITREX 1 po at onset of migraine , may repeat in 2hours . Not to exceed 2 pills in 24hrs.   vitamin C 100 MG tablet Take 100 mg by mouth. Reported on 01/02/2016        No results found for this or any previous visit (from the past 2160 hour(s)).  No results found.   ROS: 14 pt review of systems performed and negative (unless mentioned in an HPI)  Objective: BP 132/80 (BP Location: Left Arm, Cuff Size: Large)   Pulse (!) 57   Temp 97.9 F (36.6 C)   Resp 20   Ht 5\' 5"  (1.651 m)   Wt 254 lb 8 oz (115.4 kg)   LMP 11/30/2016   SpO2 100%   BMI 42.35 kg/m  Gen: Afebrile. No acute distress. Nontoxic in appearance, well-developed, well-nourished,  Very pleasant caucasian female. Obese.  HENT: AT. [place]. Bilateral TM visualized and normal in appearance, normal external auditory canal. MMM, no oral lesions, adequate dentition. Bilateral nares within normal limits. Throat without erythema, ulcerations or exudates. no Cough on exam, no hoarseness on exam. Eyes:Pupils Equal Round Reactive to light, Extraocular movements intact,  Conjunctiva without redness, discharge or icterus. Neck/lymp/endocrine: Supple,no lymphadenopathy, no thyromegaly CV: RRR no murmur , no edema, +2/4 P  posterior tibialis pulses. no carotid bruits. No JVD. Chest: CTAB, no wheeze, rhonchi or crackles. Normal  Respiratory effort. good Air movement. Abd: Soft. obese. NTND. BS present. no Masses palpated. No hepatosplenomegaly. No rebound tenderness or guarding. Skin: no rashes, purpura or petechiae. Warm and well-perfused. Skin intact. Neuro/Msk:  Normal gait. PERLA. EOMi. Alert. Oriented x3.  Cranial nerves II through XII intact. Muscle strength 5/5 upper/lower extremity. DTRs equal bilaterally. Psych: Normal affect, dress and demeanor. Normal speech. Normal thought content and judgment.  Assessment/plan: SENIYA STOFFERS is a 51 y.o. female present for CPE. Colon cancer screening Agreeable to referral for colonoscopy today.  - Ambulatory referral to Gastroenterology  Essential hypertension Rpt BP normal.  - TSH - CBC w/Diff - Lipid panel - COMPLETE METABOLIC PANEL WITH GFR - low sodium, exercise > 150 minutes a week.  - f/u q 6 months.  Hypothyroidism, unspecified type - TSH - Hemoglobin A1c - will refill levothyroxine or alter dose, depending on test results today.  - follow yearly as long as normal.  BMI 40.0-44.9, adult (HCC)/Morbid Obesity (HCC) - diet and exercise discussed.  - TSH - Lipid panel - Hemoglobin A1c High risk medication use - methotrexate use for RA, follows rheumatology - folic acid (FOLVITE) 1 MG tablet; Take 1 mg by mouth daily. - methotrexate (50 MG/ML) 1 g injection; Inject 0.6 mg/m2 into the vein once. - TSH - CBC w/Diff - COMPLETE METABOLIC PANEL WITH GFR Diabetes mellitus screening - Hemoglobin A1c Encounter for general adult medical examination with abnormal findings Patient was encouraged to exercise greater than 150 minutes a week. Patient was encouraged to choose a diet filled with fresh fruits and vegetables, and lean meats. AVS provided to patient today for education/recommendation on gender specific health and safety maintenance. -  immunizations UTD, could consider PNA series if on MTX/steroid long term.  - Colonoscopy referral placed.  - other HM UTD.  Return in about 1 year (around 12/07/2017) for CPE. 6 months for Chronic medical conditions.   Electronically signed by: Felix Pacini, DO [facility]

## 2016-12-07 NOTE — Patient Instructions (Signed)
Health Maintenance, Female Adopting a healthy lifestyle and getting preventive care can go a long way to promote health and wellness. Talk with your health care provider about what schedule of regular examinations is right for you. This is a good chance for you to check in with your provider about disease prevention and staying healthy. In between checkups, there are plenty of things you can do on your own. Experts have done a lot of research about which lifestyle changes and preventive measures are most likely to keep you healthy. Ask your health care provider for more information. Weight and diet Eat a healthy diet  Be sure to include plenty of vegetables, fruits, low-fat dairy products, and lean protein.  Do not eat a lot of foods high in solid fats, added sugars, or salt.  Get regular exercise. This is one of the most important things you can do for your health.  Most adults should exercise for at least 150 minutes each week. The exercise should increase your heart rate and make you sweat (moderate-intensity exercise).  Most adults should also do strengthening exercises at least twice a week. This is in addition to the moderate-intensity exercise. Maintain a healthy weight  Body mass index (BMI) is a measurement that can be used to identify possible weight problems. It estimates body fat based on height and weight. Your health care provider can help determine your BMI and help you achieve or maintain a healthy weight.  For females 76 years of age and older:  A BMI below 18.5 is considered underweight.  A BMI of 18.5 to 24.9 is normal.  A BMI of 25 to 29.9 is considered overweight.  A BMI of 30 and above is considered obese. Watch levels of cholesterol and blood lipids  You should start having your blood tested for lipids and cholesterol at 51 years of age, then have this test every 5 years.  You may need to have your cholesterol levels checked more often if:  Your lipid or  cholesterol levels are high.  You are older than 51 years of age.  You are at high risk for heart disease. Cancer screening Lung Cancer  Lung cancer screening is recommended for adults 64-42 years old who are at high risk for lung cancer because of a history of smoking.  A yearly low-dose CT scan of the lungs is recommended for people who:  Currently smoke.  Have quit within the past 15 years.  Have at least a 30-pack-year history of smoking. A pack year is smoking an average of one pack of cigarettes a day for 1 year.  Yearly screening should continue until it has been 15 years since you quit.  Yearly screening should stop if you develop a health problem that would prevent you from having lung cancer treatment. Breast Cancer  Practice breast self-awareness. This means understanding how your breasts normally appear and feel.  It also means doing regular breast self-exams. Let your health care provider know about any changes, no matter how small.  If you are in your 20s or 30s, you should have a clinical breast exam (CBE) by a health care provider every 1-3 years as part of a regular health exam.  If you are 34 or older, have a CBE every year. Also consider having a breast X-ray (mammogram) every year.  If you have a family history of breast cancer, talk to your health care provider about genetic screening.  If you are at high risk for breast cancer, talk  to your health care provider about having an MRI and a mammogram every year.  Breast cancer gene (BRCA) assessment is recommended for women who have family members with BRCA-related cancers. BRCA-related cancers include:  Breast.  Ovarian.  Tubal.  Peritoneal cancers.  Results of the assessment will determine the need for genetic counseling and BRCA1 and BRCA2 testing. Cervical Cancer  Your health care provider may recommend that you be screened regularly for cancer of the pelvic organs (ovaries, uterus, and vagina).  This screening involves a pelvic examination, including checking for microscopic changes to the surface of your cervix (Pap test). You may be encouraged to have this screening done every 3 years, beginning at age 24.  For women ages 66-65, health care providers may recommend pelvic exams and Pap testing every 3 years, or they may recommend the Pap and pelvic exam, combined with testing for human papilloma virus (HPV), every 5 years. Some types of HPV increase your risk of cervical cancer. Testing for HPV may also be done on women of any age with unclear Pap test results.  Other health care providers may not recommend any screening for nonpregnant women who are considered low risk for pelvic cancer and who do not have symptoms. Ask your health care provider if a screening pelvic exam is right for you.  If you have had past treatment for cervical cancer or a condition that could lead to cancer, you need Pap tests and screening for cancer for at least 20 years after your treatment. If Pap tests have been discontinued, your risk factors (such as having a new sexual partner) need to be reassessed to determine if screening should resume. Some women have medical problems that increase the chance of getting cervical cancer. In these cases, your health care provider may recommend more frequent screening and Pap tests. Colorectal Cancer  This type of cancer can be detected and often prevented.  Routine colorectal cancer screening usually begins at 51 years of age and continues through 51 years of age.  Your health care provider may recommend screening at an earlier age if you have risk factors for colon cancer.  Your health care provider may also recommend using home test kits to check for hidden blood in the stool.  A small camera at the end of a tube can be used to examine your colon directly (sigmoidoscopy or colonoscopy). This is done to check for the earliest forms of colorectal cancer.  Routine  screening usually begins at age 41.  Direct examination of the colon should be repeated every 5-10 years through 51 years of age. However, you may need to be screened more often if early forms of precancerous polyps or small growths are found. Skin Cancer  Check your skin from head to toe regularly.  Tell your health care provider about any new moles or changes in moles, especially if there is a change in a mole's shape or color.  Also tell your health care provider if you have a mole that is larger than the size of a pencil eraser.  Always use sunscreen. Apply sunscreen liberally and repeatedly throughout the day.  Protect yourself by wearing long sleeves, pants, a wide-brimmed hat, and sunglasses whenever you are outside. Heart disease, diabetes, and high blood pressure  High blood pressure causes heart disease and increases the risk of stroke. High blood pressure is more likely to develop in:  People who have blood pressure in the high end of the normal range (130-139/85-89 mm Hg).  People who are overweight or obese.  People who are African American.  If you are 59-24 years of age, have your blood pressure checked every 3-5 years. If you are 34 years of age or older, have your blood pressure checked every year. You should have your blood pressure measured twice-once when you are at a hospital or clinic, and once when you are not at a hospital or clinic. Record the average of the two measurements. To check your blood pressure when you are not at a hospital or clinic, you can use:  An automated blood pressure machine at a pharmacy.  A home blood pressure monitor.  If you are between 29 years and 60 years old, ask your health care provider if you should take aspirin to prevent strokes.  Have regular diabetes screenings. This involves taking a blood sample to check your fasting blood sugar level.  If you are at a normal weight and have a low risk for diabetes, have this test once  every three years after 51 years of age.  If you are overweight and have a high risk for diabetes, consider being tested at a younger age or more often. Preventing infection Hepatitis B  If you have a higher risk for hepatitis B, you should be screened for this virus. You are considered at high risk for hepatitis B if:  You were born in a country where hepatitis B is common. Ask your health care provider which countries are considered high risk.  Your parents were born in a high-risk country, and you have not been immunized against hepatitis B (hepatitis B vaccine).  You have HIV or AIDS.  You use needles to inject street drugs.  You live with someone who has hepatitis B.  You have had sex with someone who has hepatitis B.  You get hemodialysis treatment.  You take certain medicines for conditions, including cancer, organ transplantation, and autoimmune conditions. Hepatitis C  Blood testing is recommended for:  Everyone born from [birth year] through 1965.  Anyone with known risk factors for hepatitis C. Sexually transmitted infections (STIs)  You should be screened for sexually transmitted infections (STIs) including gonorrhea and chlamydia if:  You are sexually active and are younger than 51 years of age.  You are older than 51 years of age and your health care provider tells you that you are at risk for this type of infection.  Your sexual activity has changed since you were last screened and you are at an increased risk for chlamydia or gonorrhea. Ask your health care provider if you are at risk.  If you do not have HIV, but are at risk, it may be recommended that you take a prescription medicine daily to prevent HIV infection. This is called pre-exposure prophylaxis (PrEP). You are considered at risk if:  You are sexually active and do not regularly use condoms or know the HIV status of your partner(s).  You take drugs by injection.  You are sexually active with a partner  who has HIV. Talk with your health care provider about whether you are at high risk of being infected with HIV. If you choose to begin PrEP, you should first be tested for HIV. You should then be tested every 3 months for as long as you are taking PrEP. Pregnancy  If you are premenopausal and you may become pregnant, ask your health care provider about preconception counseling.  If you may become pregnant, take 400 to 800 micrograms (mcg) of folic acid  every day.  If you want to prevent pregnancy, talk to your health care provider about birth control (contraception). Osteoporosis and menopause  Osteoporosis is a disease in which the bones lose minerals and strength with aging. This can result in serious bone fractures. Your risk for osteoporosis can be identified using a bone density scan.  If you are 4 years of age or older, or if you are at risk for osteoporosis and fractures, ask your health care provider if you should be screened.  Ask your health care provider whether you should take a calcium or vitamin D supplement to lower your risk for osteoporosis.  Menopause may have certain physical symptoms and risks.  Hormone replacement therapy may reduce some of these symptoms and risks. Talk to your health care provider about whether hormone replacement therapy is right for you. Follow these instructions at home:  Schedule regular health, dental, and eye exams.  Stay current with your immunizations.  Do not use any tobacco products including cigarettes, chewing tobacco, or electronic cigarettes.  If you are pregnant, do not drink alcohol.  If you are breastfeeding, limit how much and how often you drink alcohol.  Limit alcohol intake to no more than 1 drink per day for nonpregnant women. One drink equals 12 ounces of beer, 5 ounces of wine, or 1 ounces of hard liquor.  Do not use street drugs.  Do not share needles.  Ask your health care provider for help if you need support  or information about quitting drugs.  Tell your health care provider if you often feel depressed.  Tell your health care provider if you have ever been abused or do not feel safe at home. This information is not intended to replace advice given to you by your health care provider. Make sure you discuss any questions you have with your health care provider. Document Released: 02/26/2011 Document Revised: 01/19/2016 Document Reviewed: 05/17/2015 Elsevier Interactive Patient Education  2017 [employer].

## 2016-12-08 LAB — HEMOGLOBIN A1C
Hgb A1c MFr Bld: 4.9 % (ref <5.7)
MEAN PLASMA GLUCOSE: 94 mg/dL

## 2016-12-10 ENCOUNTER — Encounter: Payer: Self-pay | Admitting: *Deleted

## 2016-12-10 ENCOUNTER — Telehealth: Payer: Self-pay | Admitting: Family Medicine

## 2016-12-10 MED ORDER — LEVOTHYROXINE SODIUM 112 MCG PO TABS: 112.0000 ug | ORAL_TABLET | Freq: Every day | ORAL | 0 refills | Status: DC

## 2016-12-10 NOTE — Telephone Encounter (Signed)
Please call patient: Her labs are stable with the following exceptions   - The blood work that looks at her red blood cells shows a change since last time, that can be secondary to vitamin B-12 deficiency, folate deficiency, or thyroid dysfunction. She is on methotrexate, which can cause folate deficiency. I would encourage her to follow with her rheumatologist, they are performing routine labs to ensure that she has adequate folate levels.  - Her thyroid level is now mildly over replaced. I have reduced her dose back down to 112 g to be taken daily on a the stomach, no eating or drinking 30-60 minutes after taking other than water.  - We'll need to see again in retest in 8 weeks for thyroid panel. If her rheumatologist appointment is not during that time span, would also repeat CBC, B-12 and folate levels.

## 2016-12-10 NOTE — Telephone Encounter (Signed)
Sent message with results in MY Chart. Left message on patient voice mail to check MY Chart.

## 2016-12-11 ENCOUNTER — Encounter: Payer: Self-pay | Admitting: Gastroenterology

## 2016-12-27 ENCOUNTER — Ambulatory Visit (AMBULATORY_SURGERY_CENTER): Payer: Self-pay | Admitting: *Deleted

## 2016-12-27 VITALS — Ht 64.0 in | Wt 253.2 lb

## 2016-12-27 DIAGNOSIS — Z1211 Encounter for screening for malignant neoplasm of colon: Secondary | ICD-10-CM

## 2016-12-27 MED ORDER — NA SULFATE-K SULFATE-MG SULF 17.5-3.13-1.6 GM/177ML PO SOLN: 1.0000 | Freq: Once | ORAL | 0 refills | Status: AC

## 2016-12-27 NOTE — Progress Notes (Signed)
No egg or soy allergy known to patient  No issues with past sedation with any surgeries  or procedures, no intubation problems  No diet pills per patient No home 02 use per patient  No blood thinners per patient  Pt denies issues with constipation  No A fib or A flutter  EMMI video sent to pt's e mail  

## 2017-01-03 ENCOUNTER — Encounter: Payer: Self-pay | Admitting: Gastroenterology

## 2017-01-04 ENCOUNTER — Telehealth: Payer: Self-pay | Admitting: Gastroenterology

## 2017-01-04 NOTE — Telephone Encounter (Signed)
Call patient; no answer.  Will try later

## 2017-01-07 NOTE — Telephone Encounter (Signed)
Called pt at home number. Unable to LM, no answer.  Spoke with pt. She will come by our office today to pick up a sample of the Suprep bowel prep. Suprep lot # [id number], Exp 04/20,  left at front desk on 4th floor to be picked up today. The pt will call if she has any questions.

## 2017-01-07 NOTE — Telephone Encounter (Signed)
Called pt on her cell #, unable to LM. Will try later.

## 2017-01-15 ENCOUNTER — Ambulatory Visit (AMBULATORY_SURGERY_CENTER): Payer: 59 | Admitting: Gastroenterology

## 2017-01-15 ENCOUNTER — Encounter: Payer: Self-pay | Admitting: Gastroenterology

## 2017-01-15 VITALS — BP 91/40 | HR 64 | Temp 98.6°F | Resp 18 | Ht 64.0 in | Wt 253.0 lb

## 2017-01-15 DIAGNOSIS — Z1212 Encounter for screening for malignant neoplasm of rectum: Secondary | ICD-10-CM

## 2017-01-15 DIAGNOSIS — Z1211 Encounter for screening for malignant neoplasm of colon: Secondary | ICD-10-CM | POA: Diagnosis present

## 2017-01-15 MED ORDER — SODIUM CHLORIDE 0.9 % IV SOLN: 500.0000 mL | INTRAVENOUS | Status: AC

## 2017-01-15 NOTE — Progress Notes (Signed)
  [facility] Anesthesia Post-op Note  Patient: Maria Boone  Procedure(s) Performed: colonoscopy  Patient Location: [facility] - Recovery Area  Anesthesia Type: Deep Sedation/Propofol  Level of Consciousness: awake, oriented and patient cooperative  Airway and Oxygen Therapy: Patient Spontanous Breathing  Post-op Pain: none  Post-op Assessment:  Post-op Vital signs reviewed, Patient's Cardiovascular Status Stable, Respiratory Function Stable, Patent Airway, No signs of Nausea or vomiting and Pain level controlled  Post-op Vital Signs: Reviewed and stable  Complications: No apparent anesthesia complications  Cambri Plourde E Kelii Chittum 12:19 PM

## 2017-01-15 NOTE — Progress Notes (Signed)
Pt's states no medical or surgical changes since previsit or office visit. 

## 2017-01-15 NOTE — Patient Instructions (Signed)
YOU HAD AN ENDOSCOPIC PROCEDURE TODAY AT THE [facility]:   Refer to the procedure report that was given to you for any specific questions about what was found during the examination.  If the procedure report does not answer your questions, please call your gastroenterologist to clarify.  If you requested that your care partner not be given the details of your procedure findings, then the procedure report has been included in a sealed envelope for you to review at your convenience later.  YOU SHOULD EXPECT: Some feelings of bloating in the abdomen. Passage of more gas than usual.  Walking can help get rid of the air that was put into your GI tract during the procedure and reduce the bloating. If you had a lower endoscopy (such as a colonoscopy or flexible sigmoidoscopy) you may notice spotting of blood in your stool or on the toilet paper. If you underwent a bowel prep for your procedure, you may not have a normal bowel movement for a few days.  Please Note:  You might notice some irritation and congestion in your nose or some drainage.  This is from the oxygen used during your procedure.  There is no need for concern and it should clear up in a day or so.  SYMPTOMS TO REPORT IMMEDIATELY:   Following lower endoscopy (colonoscopy or flexible sigmoidoscopy):  Excessive amounts of blood in the stool  Significant tenderness or worsening of abdominal pains  Swelling of the abdomen that is new, acute  Fever of 100F or higher    For urgent or emergent issues, a gastroenterologist can be reached at any hour by calling [phone].   DIET:  We do recommend a small meal at first, but then you may proceed to your regular diet.  Drink plenty of fluids but you should avoid alcoholic beverages for 24 hours.  ACTIVITY:  You should plan to take it easy for the rest of today and you should NOT DRIVE or use heavy machinery until tomorrow (because of the sedation medicines used during the test).     FOLLOW UP: Our staff will call the number listed on your records the next business day following your procedure to check on you and address any questions or concerns that you may have regarding the information given to you following your procedure. If we do not reach you, we will leave a message.  However, if you are feeling well and you are not experiencing any problems, there is no need to return our call.  We will assume that you have returned to your regular daily activities without incident.  If any biopsies were taken you will be contacted by phone or by letter within the next 1-3 weeks.  Please call us at [phone] if you have not heard about the biopsies in 3 weeks.    SIGNATURES/CONFIDENTIALITY: You and/or your care partner have signed paperwork which will be entered into your electronic medical record.  These signatures attest to the fact that that the information above on your After Visit Summary has been reviewed and is understood.  Full responsibility of the confidentiality of this discharge information lies with you and/or your care-partner.    Information on hemorrhoids given to you today  Repeat colonoscopy in 10 years

## 2017-01-15 NOTE — Op Note (Signed)
[facility] Patient Name: Maria Boone Procedure Date: 01/15/2017 11:51 AM MRN: [id number] Endoscopist: Napoleon Form , MD Age: 51 Referring MD:  Date of Birth: 1966/06/04 Gender: Female Account #: [id number] Procedure:                Colonoscopy Indications:              Screening for colorectal malignant neoplasm Medicines:                Monitored Anesthesia Care Procedure:                Pre-Anesthesia Assessment:                           - Prior to the procedure, a History and Physical                            was performed, and patient medications and                            allergies were reviewed. The patient's tolerance of                            previous anesthesia was also reviewed. The risks                            and benefits of the procedure and the sedation                            options and risks were discussed with the patient.                            All questions were answered, and informed consent                            was obtained. Prior Anticoagulants: The patient has                            taken no previous anticoagulant or antiplatelet                            agents. ASA Grade Assessment: II - A patient with                            mild systemic disease. After reviewing the risks                            and benefits, the patient was deemed in                            satisfactory condition to undergo the procedure.                           After obtaining informed consent, the colonoscope  was passed under direct vision. Throughout the                            procedure, the patient's blood pressure, pulse, and                            oxygen saturations were monitored continuously. The                            Colonoscope was introduced through the anus and                            advanced to the the cecum, identified by                            appendiceal  orifice and ileocecal valve. The                            colonoscopy was performed without difficulty. The                            patient tolerated the procedure well. The quality                            of the bowel preparation was excellent. The                            ileocecal valve, appendiceal orifice, and rectum                            were photographed. Scope In: 12:00:02 PM Scope Out: 12:13:51 PM Scope Withdrawal Time: 0 hours 9 minutes 27 seconds  Total Procedure Duration: 0 hours 13 minutes 49 seconds  Findings:                 The perianal and digital rectal examinations were                            normal.                           Non-bleeding internal hemorrhoids were found during                            retroflexion. The hemorrhoids were small. Complications:            No immediate complications. Estimated Blood Loss:     Estimated blood loss: none. Impression:               - Non-bleeding internal hemorrhoids.                           - No specimens collected. Recommendation:           - Patient has a contact number available for  emergencies. The signs and symptoms of potential                            delayed complications were discussed with the                            patient. Return to normal activities tomorrow.                            Written discharge instructions were provided to the                            patient.                           - Resume previous diet.                           - Continue present medications.                           - Repeat colonoscopy in 10 years for screening                            purposes.                           - Return to GI clinic PRN. Napoleon Form, MD 01/15/2017 12:20:45 PM This report has been signed electronically.

## 2017-01-16 ENCOUNTER — Telehealth: Payer: Self-pay | Admitting: *Deleted

## 2017-01-16 NOTE — Telephone Encounter (Signed)
  Follow up Call-  Call back number 01/15/2017  Post procedure Call Back phone  # [phone]  Permission to leave phone message Yes  Some recent data might be hidden     No answer, left message.  

## 2017-01-16 NOTE — Telephone Encounter (Signed)
  Follow up Call-  Call back number 01/15/2017  Post procedure Call Back phone  # [phone]  Permission to leave phone message Yes  Some recent data might be hidden     No answer, left message.

## 2017-02-06 ENCOUNTER — Telehealth: Payer: Self-pay | Admitting: *Deleted

## 2017-02-06 ENCOUNTER — Encounter: Payer: Self-pay | Admitting: *Deleted

## 2017-02-06 DIAGNOSIS — R7989 Other specified abnormal findings of blood chemistry: Secondary | ICD-10-CM

## 2017-02-06 NOTE — Telephone Encounter (Signed)
Patient is on the lab schedule for tomorrow at 4:15. Can you review and let us know what labs are needed since patient also follows with Rheumatologist no orders are in system.

## 2017-02-06 NOTE — Telephone Encounter (Signed)
I placed orders for thyroid. Make sure she has follow up appt. For results.   The other labs can be completed at rheumatology since she should have appt soon to keep them on a regular schedule with labs.

## 2017-02-07 ENCOUNTER — Other Ambulatory Visit (INDEPENDENT_AMBULATORY_CARE_PROVIDER_SITE_OTHER): Payer: 59

## 2017-02-07 DIAGNOSIS — R946 Abnormal results of thyroid function studies: Secondary | ICD-10-CM

## 2017-02-07 DIAGNOSIS — R7989 Other specified abnormal findings of blood chemistry: Secondary | ICD-10-CM

## 2017-02-07 LAB — TSH: TSH: 0.46 mIU/L

## 2017-02-07 LAB — T3, FREE: T3 FREE: 2.3 pg/mL (ref 2.3–4.2)

## 2017-02-07 LAB — T4, FREE: Free T4: 1.3 ng/dL (ref 0.8–1.8)

## 2017-02-11 ENCOUNTER — Telehealth: Payer: Self-pay | Admitting: Family Medicine

## 2017-02-11 NOTE — Telephone Encounter (Signed)
Pt was provided the courtesy of pre-appt lab collection. She failed to follow up on her thyroid condition. Provider appt for follow up is required secondary to medication changes and prior abnormal results. Lab appt only, without provider follow up, is not appropriate medical management.  We have attempted to reach patient to schedule via phone and echart.  No medication refills will be provided until she follows up on change in therapy.

## 2017-03-12 ENCOUNTER — Other Ambulatory Visit: Payer: Self-pay | Admitting: Family Medicine

## 2017-03-14 ENCOUNTER — Telehealth: Payer: Self-pay | Admitting: Family Medicine

## 2017-03-14 NOTE — Telephone Encounter (Signed)
Patient is requesting a CB about Levothryoxine Rx. She was advised to have a 6 month check up at [facility] in April. Patient has scheduled that appointment in October. She is not sure why she needs another appointment.

## 2017-03-15 ENCOUNTER — Encounter: Payer: Self-pay | Admitting: Family Medicine

## 2017-03-15 MED ORDER — LEVOTHYROXINE SODIUM 112 MCG PO TABS: 112.0000 ug | ORAL_TABLET | Freq: Every day | ORAL | 3 refills | Status: DC

## 2017-03-15 NOTE — Telephone Encounter (Signed)
Spoke with patient she states she came in and got her lab work done in June and has appt in October for follow up she needs refills on levothyroxine. She states she doesn't feel she needs to see the Dr since she was seen in April and had repeat labs in June and is scheduled to see the Dr in October. She is requesting we refill her thyroid medication. Please advise.

## 2017-03-18 ENCOUNTER — Other Ambulatory Visit: Payer: Self-pay | Admitting: *Deleted

## 2017-03-18 ENCOUNTER — Telehealth: Payer: Self-pay

## 2017-03-18 MED ORDER — LEVOTHYROXINE SODIUM 112 MCG PO TABS: 112.0000 ug | ORAL_TABLET | Freq: Every day | ORAL | 3 refills | Status: DC

## 2017-03-18 NOTE — Telephone Encounter (Signed)
[facility] Night - Client TELEPHONE ADVICE RECORD [facility] Medical Call Center Patient Name: Maria Boone Gender: Female DOB: [birth year] Age: 51 Y 8 D Return Phone Number: [phone] (Primary) City/State/Zip: [place] [address] Client [facility] Night - Client Client Site [facility] Night Physician Felix Pacini Who Is Calling Patient / Member / Family / Caregiver Call Type Triage / Clinical Relationship To Patient Self Return Phone Number [phone] (Primary) Chief Complaint Prescription Refill or Medication Request (non symptomatic) Reason for Call Symptomatic / Request for Health Information Initial Comment dr was to send in rx friday, nothing at pharmacy Nurse Assessment Nurse: Alvera Singh, RN, [name] Date/Time (Eastern Time): 03/17/2017 10:54:43 AM Confirm and document reason for call. If symptomatic, describe symptoms. ---Caller says she needs a refill on levothyroxine . Says the office was supposed to call it in on Friday. Guidelines Guideline Title Affirmed Question Disp. Time Lamount Cohen Time) Disposition Final User 03/17/2017 10:59:48 AM Clinical Call Yes Alvera Singh, RN, [name]

## 2017-03-18 NOTE — Telephone Encounter (Signed)
Prescription was sent in to pharmacy on 03/15/17 patient called and requested it be called in to a different pharmacy resent to [facility] in [place].

## 2017-05-07 ENCOUNTER — Ambulatory Visit: Payer: 59 | Admitting: Family Medicine

## 2017-05-07 ENCOUNTER — Encounter: Payer: Self-pay | Admitting: Family Medicine

## 2017-05-07 ENCOUNTER — Ambulatory Visit (INDEPENDENT_AMBULATORY_CARE_PROVIDER_SITE_OTHER): Payer: 59 | Admitting: Family Medicine

## 2017-05-07 VITALS — BP 122/72 | HR 100 | Temp 98.3°F | Resp 20 | Wt 246.8 lb

## 2017-05-07 DIAGNOSIS — R05 Cough: Secondary | ICD-10-CM | POA: Diagnosis not present

## 2017-05-07 DIAGNOSIS — R059 Cough, unspecified: Secondary | ICD-10-CM

## 2017-05-07 DIAGNOSIS — J40 Bronchitis, not specified as acute or chronic: Secondary | ICD-10-CM | POA: Diagnosis not present

## 2017-05-07 MED ORDER — BENZONATATE 100 MG PO CAPS: 100.0000 mg | ORAL_CAPSULE | Freq: Three times a day (TID) | ORAL | 0 refills | Status: DC | PRN

## 2017-05-07 MED ORDER — AZITHROMYCIN 250 MG PO TABS: ORAL_TABLET | ORAL | 0 refills | Status: DC

## 2017-05-07 NOTE — Progress Notes (Signed)
Maria Boone , Dec 18, 1965, 51 y.o., female MRN: [id number] Patient Care Team    Relationship Specialty Notifications Start End  Natalia Leatherwood, DO PCP - General Family Medicine  10/10/15   Kenna Gilbert, MD Referring Physician Orthopedic Surgery  01/02/16     Chief Complaint  Patient presents with  . Ear Pain  . URI     Subjective: Pt presents for an OV with complaints of bilateral ear pressure, drainage and scratchy throat for a few weeks. She also endorses a new cough over the last few days that is not productive. She had been on an allergy pill. She is taking prednisone and methotrexate for RA. She denies fever, chills, nausea, vomit, rash or headache. She states she is coughing so hard she has experienced urinary incontinence and her chest feels like it is tight or burning.   Depression screen [facility] 2/9 12/07/2016  Decreased Interest 0  Down, Depressed, Hopeless 0  PHQ - 2 Score 0    Allergies  Allergen Reactions  . Penicillins Other (See Comments)    ORAL BLISTERS/SORES.  Marland Kitchen Amoxicillin   . Percocet [Oxycodone-Acetaminophen] Itching    Nausea also  . Nitrofurantoin Monohyd Macro Rash   Social History  Substance Use Topics  . Smoking status: Never Smoker  . Smokeless tobacco: Never Used  . Alcohol use No   Past Medical History:  Diagnosis Date  . Allergy   . Anemia   . Arthritis   . Hypertension   . Migraine   . Obese   . Osteoporosis   . Thyroid disease    Past Surgical History:  Procedure Laterality Date  . CERVICAL BIOPSY  W/ LOOP ELECTRODE EXCISION    . childbirth     x2  . REPLACEMENT TOTAL KNEE  08/31/2014   Family History  Problem Relation Age of Onset  . Alzheimer's disease Mother   . Cancer Father        lymphoma, exposed to uranium  . Kidney disease Father   . Cancer Paternal Uncle        lymphoma, exposed to uranium  . Hypertension Other   . Kidney disease Other   . Lymphoma Other   . Colon cancer Neg Hx   . Colon polyps Neg Hx     . Esophageal cancer Neg Hx   . Rectal cancer Neg Hx   . Stomach cancer Neg Hx    Allergies as of 05/07/2017      Reactions   Penicillins Other (See Comments)   ORAL BLISTERS/SORES.   Amoxicillin    Percocet [oxycodone-acetaminophen] Itching   Nausea also   Nitrofurantoin Monohyd Macro Rash      Medication List       Accurate as of 05/07/17  1:56 PM. Always use your most recent med list.          amitriptyline 25 MG tablet Commonly known as:  ELAVIL Take 1 tablet (25 mg total) by mouth at bedtime.   calcium-vitamin D 250-100 MG-UNIT tablet Take 2 tablets by mouth 2 (two) times daily. 600 mg calcium and 1600 vitamin d daily   diclofenac sodium 1 % Gel Commonly known as:  VOLTAREN Apply dime size ammount 4x a day as needed for left knee pain   fluticasone 50 MCG/ACT nasal spray Commonly known as:  FLONASE Place 2 sprays into both nostrils daily.   folic acid 1 MG tablet Commonly known as:  FOLVITE Take 1 mg by mouth daily.   levothyroxine  112 MCG tablet Commonly known as:  SYNTHROID, LEVOTHROID Take 1 tablet (112 mcg total) by mouth daily. On an empty stomach.   lisinopril 10 MG tablet Commonly known as:  PRINIVIL,ZESTRIL Take 1 tablet (10 mg total) by mouth daily.   LUMIGAN 0.01 % Soln Generic drug:  bimatoprost Apply 1 drop to eye at bedtime.   meloxicam 15 MG tablet Commonly known as:  MOBIC Take 1 tablet (15 mg total) by mouth daily.   methotrexate 1 g injection Commonly known as:  50 mg/ml Inject 0.8 mg/m2 into the vein once.   predniSONE 5 MG tablet Commonly known as:  DELTASONE Take 5 mg by mouth daily with breakfast.   senna-docusate 8.6-50 MG tablet Commonly known as:  Senokot-S Take by mouth.   SUMAtriptan 100 MG tablet Commonly known as:  IMITREX 1 po at onset of migraine , may repeat in 2hours . Not to exceed 2 pills in 24hrs.   vitamin C 100 MG tablet Take 100 mg by mouth. Reported on 01/02/2016       All past medical history,  surgical history, allergies, family history, immunizations andmedications were updated in the EMR today and reviewed under the history and medication portions of their EMR.     ROS: Negative, with the exception of above mentioned in HPI   Objective:  BP 122/72 (BP Location: Left Arm, Patient Position: Sitting, Cuff Size: Large)   Pulse 100   Temp 98.3 F (36.8 C)   Resp 20   Wt 246 lb 12 oz (111.9 kg)   SpO2 96%   BMI 42.35 kg/m  Body mass index is 42.35 kg/m. Gen: Afebrile. No acute distress. Nontoxic in appearance, well developed, well nourished.  HENT: AT. [place]. Bilateral TM visualized without erythema or bulging. MMM, no oral lesions. Bilateral nares without erythema, swelling or drainage. Throat without erythema or exudates. Deep cough present. Hoarseness present.  Eyes:Pupils Equal Round Reactive to light, Extraocular movements intact,  Conjunctiva without redness, discharge or icterus. Neck/lymp/endocrine: Supple,no lymphadenopathy CV: RRR  Chest: CTAB, no wheeze or crackles. Good air movement, normal resp effort.  Abd: Soft. NTND. BS present.  Skin: no rashes, purpura or petechiae.  Neuro:  Normal gait. PERLA. EOMi. Alert. Oriented x3  No exam data present No results found. No results found for this or any previous visit (from the past 24 hour(s)).  Assessment/Plan: Maria Boone is a 51 y.o. female present for OV for  Bronchitis/cough Exam consistent with bronchitis. Patient on methotrexate and low-dose prednisone for RA. Will treat with azithromycin. Tessalon Perles for cough. May start over-the-counter Flonase and Mucinex. Rest and hydrate. Follow-up 2 weeks if no improvement, sooner if worsening.  Reviewed expectations re: course of current medical issues.  Discussed self-management of symptoms.  Outlined signs and symptoms indicating need for more acute intervention.  Patient verbalized understanding and all questions were answered.  Patient received an  After-Visit Summary.    No orders of the defined types were placed in this encounter.    Note is dictated utilizing voice recognition software. Although note has been proof read prior to signing, occasional typographical errors still can be missed. If any questions arise, please do not hesitate to call for verification.   electronically signed by:  Felix Pacini, DO  [facility] [place]

## 2017-05-07 NOTE — Patient Instructions (Signed)
  Tessalon perles prescribed.  Try a daily antihistamine or flonase as well.   Rest, hydrate.  +/- flonase, mucinex (DM if cough), nettie pot or nasal saline.  Z-pack prescribed, take until completed.  If cough present it can last up to 6-8 weeks.  F/U 2 weeks of not improved.     Acute Bronchitis, Adult Acute bronchitis is when air tubes (bronchi) in the lungs suddenly get swollen. The condition can make it hard to breathe. It can also cause these symptoms:  A cough.  Coughing up clear, yellow, or green mucus.  Wheezing.  Chest congestion.  Shortness of breath.  A fever.  Body aches.  Chills.  A sore throat.  Follow these instructions at home: Medicines  Take over-the-counter and prescription medicines only as told by your doctor.  If you were prescribed an antibiotic medicine, take it as told by your doctor. Do not stop taking the antibiotic even if you start to feel better. General instructions  Rest.  Drink enough fluids to keep your pee (urine) clear or pale yellow.  Avoid smoking and secondhand smoke. If you smoke and you need help quitting, ask your doctor. Quitting will help your lungs heal faster.  Use an inhaler, cool mist vaporizer, or humidifier as told by your doctor.  Keep all follow-up visits as told by your doctor. This is important. How is this prevented? To lower your risk of getting this condition again:  Wash your hands often with soap and water. If you cannot use soap and water, use hand sanitizer.  Avoid contact with people who have cold symptoms.  Try not to touch your hands to your mouth, nose, or eyes.  Make sure to get the flu shot every year.  Contact a doctor if:  Your symptoms do not get better in 2 weeks. Get help right away if:  You cough up blood.  You have chest pain.  You have very bad shortness of breath.  You become dehydrated.  You faint (pass out) or keep feeling like you are going to pass out.  You keep  throwing up (vomiting).  You have a very bad headache.  Your fever or chills gets worse. This information is not intended to replace advice given to you by your health care provider. Make sure you discuss any questions you have with your health care provider. Document Released: 01/30/2008 Document Revised: 03/21/2016 Document Reviewed: 02/01/2016 Elsevier Interactive Patient Education  2017 [employer].

## 2017-05-11 ENCOUNTER — Telehealth: Payer: Self-pay | Admitting: Family Medicine

## 2017-05-11 NOTE — Telephone Encounter (Signed)
Received call from team health. Patient requesting stronger cough medication. I advised the RN that I cannot do this via the phone as stronger cough medications have pain medication in them. Patient is to see a walk-in clinic or urgent care.

## 2017-05-13 ENCOUNTER — Telehealth: Payer: Self-pay
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
## 2017-05-13 NOTE — Telephone Encounter (Signed)
Noted this was addressed by on call physician.

## 2017-06-12 ENCOUNTER — Telehealth: Payer: Self-pay | Admitting: Family Medicine

## 2017-06-12 ENCOUNTER — Ambulatory Visit (INDEPENDENT_AMBULATORY_CARE_PROVIDER_SITE_OTHER): Payer: 59 | Admitting: Family Medicine

## 2017-06-12 ENCOUNTER — Encounter: Payer: Self-pay | Admitting: Family Medicine

## 2017-06-12 VITALS — BP 136/71 | HR 63 | Temp 98.0°F | Resp 16 | Wt 248.0 lb

## 2017-06-12 DIAGNOSIS — E039 Hypothyroidism, unspecified: Secondary | ICD-10-CM | POA: Diagnosis not present

## 2017-06-12 DIAGNOSIS — M0579 Rheumatoid arthritis with rheumatoid factor of multiple sites without organ or systems involvement: Secondary | ICD-10-CM

## 2017-06-12 DIAGNOSIS — I10 Essential (primary) hypertension: Secondary | ICD-10-CM

## 2017-06-12 MED ORDER — LISINOPRIL 10 MG PO TABS: 10.0000 mg | ORAL_TABLET | Freq: Every day | ORAL | 1 refills | Status: DC

## 2017-06-12 NOTE — Patient Instructions (Signed)
Your BP is borderline but is ok.  Low sodium diet.   Lisinopril refills prescribed.    F/U 6 months (can be with physical) and fasting labs that appt.

## 2017-06-12 NOTE — Progress Notes (Signed)
Maria Boone , 1966/02/16, 51 y.o., female MRN: [id number] Patient Care Team    Relationship Specialty Notifications Start End  Natalia Leatherwood, DO PCP - General Family Medicine  10/10/15   Kenna Gilbert, MD Referring Physician Orthopedic Surgery  01/02/16     Chief Complaint  Patient presents with  . Hypertension    follow up     Subjective:   Hypertension/morbid obesity:  Pt reports compliance with lisinopril 10 mg QD. Blood pressures ranges at home are not checked. Patient denies chest pain, shortness of breath, dizziness or lower extremity edema.  BMP: 12/07/2016 WNL CBC: 12/07/2016 MCV 101, otherwise normal. She has labs every 3 mos at [facility] for MTX use.  Lipid: 12/07/2016 WNL Diet: Does not follow routine diet.  Exercise: not routinely  RF: HTN, obesity.   Hypothyroid: Pt feels fine. Reports compliance daily with 112 mcg levothyroxine, last TSH 02/07/2017 was normal.   Depression screen [facility] 2/9 12/07/2016  Decreased Interest 0  Down, Depressed, Hopeless 0  PHQ - 2 Score 0    Allergies  Allergen Reactions  . Penicillins Other (See Comments)    ORAL BLISTERS/SORES.  Marland Kitchen Amoxicillin   . Percocet [Oxycodone-Acetaminophen] Itching    Nausea also  . Nitrofurantoin Monohyd Macro Rash   Social History  Substance Use Topics  . Smoking status: Never Smoker  . Smokeless tobacco: Never Used  . Alcohol use No   Past Medical History:  Diagnosis Date  . Allergy   . Anemia   . Arthritis   . Hypertension   . Migraine   . Obese   . Osteoporosis   . Thyroid disease    Past Surgical History:  Procedure Laterality Date  . CERVICAL BIOPSY  W/ LOOP ELECTRODE EXCISION    . childbirth     x2  . REPLACEMENT TOTAL KNEE  08/31/2014   Family History  Problem Relation Age of Onset  . Alzheimer's disease Mother   . Cancer Father        lymphoma, exposed to uranium  . Kidney disease Father   . Cancer Paternal Uncle        lymphoma, exposed to uranium  . Hypertension  Other   . Kidney disease Other   . Lymphoma Other   . Colon cancer Neg Hx   . Colon polyps Neg Hx   . Esophageal cancer Neg Hx   . Rectal cancer Neg Hx   . Stomach cancer Neg Hx    Allergies as of 06/12/2017      Reactions   Penicillins Other (See Comments)   ORAL BLISTERS/SORES.   Amoxicillin    Percocet [oxycodone-acetaminophen] Itching   Nausea also   Nitrofurantoin Monohyd Macro Rash      Medication List       Accurate as of 06/12/17  3:41 PM. Always use your most recent med list.          amitriptyline 25 MG tablet Commonly known as:  ELAVIL Take 1 tablet (25 mg total) by mouth at bedtime.   azithromycin 250 MG tablet Commonly known as:  ZITHROMAX Z-PAK 2 tabs day 1, then 1 tab for 4 days   benzonatate 100 MG capsule Commonly known as:  TESSALON PERLES Take 1 capsule (100 mg total) by mouth 3 (three) times daily as needed for cough.   calcium-vitamin D 250-100 MG-UNIT tablet Take 2 tablets by mouth 2 (two) times daily. 600 mg calcium and 1600 vitamin d daily   diclofenac sodium 1 %  Gel Commonly known as:  VOLTAREN Apply dime size ammount 4x a day as needed for left knee pain   fluticasone 50 MCG/ACT nasal spray Commonly known as:  FLONASE Place 2 sprays into both nostrils daily.   folic acid 1 MG tablet Commonly known as:  FOLVITE Take 1 mg by mouth daily.   levothyroxine 112 MCG tablet Commonly known as:  SYNTHROID, LEVOTHROID Take 1 tablet (112 mcg total) by mouth daily. On an empty stomach.   lisinopril 10 MG tablet Commonly known as:  PRINIVIL,ZESTRIL Take 1 tablet (10 mg total) by mouth daily.   LUMIGAN 0.01 % Soln Generic drug:  bimatoprost Apply 1 drop to eye at bedtime.   meloxicam 15 MG tablet Commonly known as:  MOBIC Take 1 tablet (15 mg total) by mouth daily.   methotrexate 1 g injection Commonly known as:  50 mg/ml Inject 0.8 mg/m2 into the vein once.   predniSONE 5 MG tablet Commonly known as:  DELTASONE Take 5 mg by mouth  daily with breakfast.   senna-docusate 8.6-50 MG tablet Commonly known as:  Senokot-S Take by mouth.   SUMAtriptan 100 MG tablet Commonly known as:  IMITREX 1 po at onset of migraine , may repeat in 2hours . Not to exceed 2 pills in 24hrs.   vitamin C 100 MG tablet Take 100 mg by mouth. Reported on 01/02/2016       All past medical history, surgical history, allergies, family history, immunizations andmedications were updated in the EMR today and reviewed under the history and medication portions of their EMR.     ROS: Negative, with the exception of above mentioned in HPI   Objective:  BP 136/71 (BP Location: Left Arm, Patient Position: Sitting, Cuff Size: Large)   Pulse 63   Temp 98 F (36.7 C) (Oral)   Resp 16   Wt 248 lb (112.5 kg)   SpO2 100%   BMI 42.57 kg/m  Body mass index is 42.57 kg/m.  Gen: Afebrile. No acute distress. Nontoxic in appearance, well developed. Obese HENT: AT. [place].  MMM.  Eyes:Pupils Equal Round Reactive to light, Extraocular movements intact,  Conjunctiva without redness, discharge or icterus. CV: RRR no murmur, no edema, +2/4 P posterior tibialis pulses Chest: CTAB, no wheeze or crackles Abd: Soft. NTND. BS present.  Neuro:  Normal gait. PERLA. EOMi. Alert. Oriented x3 No exam data present No results found. No results found for this or any previous visit (from the past 24 hour(s)).  Assessment/Plan: Maria Boone is a 51 y.o. female present for OV for  Essential hypertension/Morbid obesity - stable. Continue lisinopril 10 mg daily. - low sodium. Exercise greater than 150 minutes a week. - lisinopril (PRINIVIL,ZESTRIL) 10 MG tablet; Take 1 tablet (10 mg total) by mouth daily.  Dispense: 90 tablet; Refill: 1 - Follow-up 6 months.  Hypothyroidism, unspecified type/Morbid obesity (HCC) - continue current regimen, TSh yearly with cpe  Rheumatoid arthritis involving multiple sites with positive rheumatoid factor (HCC) - followed by  rheumatology. Prescribed MTX.    Reviewed expectations re: course of current medical issues.  Discussed self-management of symptoms.  Outlined signs and symptoms indicating need for more acute intervention.  Patient verbalized understanding and all questions were answered.  Patient received an After-Visit Summary.    No orders of the defined types were placed in this encounter.    Note is dictated utilizing voice recognition software. Although note has been proof read prior to signing, occasional typographical errors still can be missed.  If any questions arise, please do not hesitate to call for verification.   electronically signed by:  Howard Pouch, DO  [place]

## 2017-06-12 NOTE — Telephone Encounter (Signed)
Maureen with [facility] [place] calling to advise brand of levothyroxine (SYNTHROID, LEVOTHROID) 112 MCG tablet has been changed to [employer]

## 2017-06-12 NOTE — Telephone Encounter (Signed)
noted 

## 2017-06-14 ENCOUNTER — Ambulatory Visit: Payer: 59 | Admitting: Family Medicine

## 2017-06-14 ENCOUNTER — Other Ambulatory Visit: Payer: Self-pay | Admitting: *Deleted

## 2017-06-14 DIAGNOSIS — I10 Essential (primary) hypertension: Secondary | ICD-10-CM

## 2017-06-14 MED ORDER — LISINOPRIL 10 MG PO TABS: 10.0000 mg | ORAL_TABLET | Freq: Every day | ORAL | 1 refills | Status: DC

## 2017-07-11 LAB — HM MAMMOGRAPHY

## 2017-07-12 ENCOUNTER — Encounter: Payer: Self-pay | Admitting: *Deleted

## 2017-07-12 ENCOUNTER — Telehealth: Payer: Self-pay | Admitting: Family Medicine

## 2017-07-12 NOTE — Telephone Encounter (Signed)
Message left on voice mail for patient to return call. 

## 2017-07-12 NOTE — Telephone Encounter (Signed)
Please inform pt: - mammogram was normal.

## 2017-07-15 NOTE — Telephone Encounter (Signed)
Patient notified and verbalized understanding. 

## 2017-10-25 ENCOUNTER — Ambulatory Visit (INDEPENDENT_AMBULATORY_CARE_PROVIDER_SITE_OTHER): Payer: 59 | Admitting: Family Medicine

## 2017-10-25 ENCOUNTER — Encounter: Payer: Self-pay | Admitting: Family Medicine

## 2017-10-25 VITALS — BP 114/73 | HR 67 | Temp 98.8°F | Ht 64.0 in | Wt 249.0 lb

## 2017-10-25 DIAGNOSIS — R599 Enlarged lymph nodes, unspecified: Secondary | ICD-10-CM | POA: Diagnosis not present

## 2017-10-25 DIAGNOSIS — E039 Hypothyroidism, unspecified: Secondary | ICD-10-CM

## 2017-10-25 DIAGNOSIS — Z79899 Other long term (current) drug therapy: Secondary | ICD-10-CM

## 2017-10-25 DIAGNOSIS — E049 Nontoxic goiter, unspecified: Secondary | ICD-10-CM

## 2017-10-25 DIAGNOSIS — R059 Cough, unspecified: Secondary | ICD-10-CM

## 2017-10-25 DIAGNOSIS — R05 Cough: Secondary | ICD-10-CM

## 2017-10-25 LAB — T4, FREE: Free T4: 0.69 ng/dL (ref 0.60–1.60)

## 2017-10-25 LAB — TSH: TSH: 4.03 u[IU]/mL (ref 0.35–4.50)

## 2017-10-25 LAB — T3, FREE: T3 FREE: 2.4 pg/mL (ref 2.3–4.2)

## 2017-10-25 MED ORDER — GUAIFENESIN-DM 100-10 MG/5ML PO SYRP: 5.0000 mL | ORAL_SOLUTION | ORAL | 0 refills | Status: DC | PRN

## 2017-10-25 MED ORDER — AZITHROMYCIN 250 MG PO TABS: ORAL_TABLET | ORAL | 0 refills | Status: DC

## 2017-10-25 MED ORDER — PREDNISONE 20 MG PO TABS: ORAL_TABLET | ORAL | 0 refills | Status: DC

## 2017-10-25 MED ORDER — BENZONATATE 200 MG PO CAPS: 200.0000 mg | ORAL_CAPSULE | Freq: Two times a day (BID) | ORAL | 0 refills | Status: DC | PRN

## 2017-10-25 NOTE — Patient Instructions (Addendum)
Tessalon perles and cough syrup called to pharmacy.  Start antihistamine of choice daily.  Continue PPI.  Could be lisinopril but lets rule out other potential causes.  You do not look infected.   Z-pack and prednisone burst printed, if symptoms are not improving on the above changes.   I have ordered an [place] of your thyroid, it does feel enlarged. They will call you to schedule.

## 2017-10-25 NOTE — Progress Notes (Signed)
Maria Boone , 1966/02/14, 52 y.o., female MRN: [id number] Patient Care Team    Relationship Specialty Notifications Start End  Natalia Leatherwood, DO PCP - General Family Medicine  10/10/15   Kenna Gilbert, MD Referring Physician Orthopedic Surgery  01/02/16     Chief Complaint  Patient presents with  . Cough    pt c/o of non productive cough X 4 weeks plus itchy throat and swollen glands.     Subjective: Pt presents for an OV with complaints of sore throat and swollen glands of 4 weeks duration.  Patient reports approximately 4 weeks ago she noticed some swollen glands and itchy throat. Her cough has also developed, that is worse at night. She started taking omeprazole twice a day, but did not feel that improved her symptoms much. She denies any current nausea, vomit, diarrhea or abdominal pain. She reports she's worried about her thyroid because she had similar symptoms when her thyroid level was abnormal. She had a TSH level on 09/27/2017 2.2. She reports originally she felt like she had a mild fever, chills and a headache but those have resolved. Her husband has been battling bronchitis. Patient is on immune modulators for rheumatoid arthritis.   Depression screen [facility] 2/9 12/07/2016  Decreased Interest 0  Down, Depressed, Hopeless 0  PHQ - 2 Score 0    Allergies  Allergen Reactions  . Penicillins Other (See Comments)    ORAL BLISTERS/SORES.  Marland Kitchen Amoxicillin   . Percocet [Oxycodone-Acetaminophen] Itching    Nausea also  . Nitrofurantoin Monohyd Macro Rash   Social History   Tobacco Use  . Smoking status: Never Smoker  . Smokeless tobacco: Never Used  Substance Use Topics  . Alcohol use: No   Past Medical History:  Diagnosis Date  . Allergy   . Anemia   . Arthritis   . Hypertension   . Migraine   . Obese   . Osteoporosis   . Thyroid disease    Past Surgical History:  Procedure Laterality Date  . CERVICAL BIOPSY  W/ LOOP ELECTRODE EXCISION    . childbirth       x2  . REPLACEMENT TOTAL KNEE  08/31/2014   Family History  Problem Relation Age of Onset  . Alzheimer's disease Mother   . Cancer Father        lymphoma, exposed to uranium  . Kidney disease Father   . Cancer Paternal Uncle        lymphoma, exposed to uranium  . Hypertension Other   . Kidney disease Other   . Lymphoma Other   . Colon cancer Neg Hx   . Colon polyps Neg Hx   . Esophageal cancer Neg Hx   . Rectal cancer Neg Hx   . Stomach cancer Neg Hx    Allergies as of 10/25/2017      Reactions   Penicillins Other (See Comments)   ORAL BLISTERS/SORES.   Amoxicillin    Percocet [oxycodone-acetaminophen] Itching   Nausea also   Nitrofurantoin Monohyd Macro Rash      Medication List        Accurate as of 10/25/17 10:34 AM. Always use your most recent med list.          calcium-vitamin D 250-100 MG-UNIT tablet Take 2 tablets by mouth 2 (two) times daily. 600 mg calcium and 1600 vitamin d daily   diclofenac sodium 1 % Gel Commonly known as:  VOLTAREN Apply dime size ammount 4x a day as needed  for left knee pain   fluticasone 50 MCG/ACT nasal spray Commonly known as:  FLONASE Place 2 sprays into both nostrils daily.   folic acid 1 MG tablet Commonly known as:  FOLVITE Take 1 mg by mouth daily.   HUMIRA PEN 40 MG/0.4ML Pnkt Generic drug:  Adalimumab   levothyroxine 112 MCG tablet Commonly known as:  SYNTHROID, LEVOTHROID Take 1 tablet (112 mcg total) by mouth daily. On an empty stomach.   lisinopril 10 MG tablet Commonly known as:  PRINIVIL,ZESTRIL Take 1 tablet (10 mg total) by mouth daily.   LUMIGAN 0.01 % Soln Generic drug:  bimatoprost Apply 1 drop to eye at bedtime.   meloxicam 15 MG tablet Commonly known as:  MOBIC Take 1 tablet (15 mg total) by mouth daily.   methotrexate 1 g injection Commonly known as:  50 mg/ml Inject 0.8 mg/m2 into the vein once.   predniSONE 5 MG tablet Commonly known as:  DELTASONE Take 5 mg by mouth daily with  breakfast.   SUMAtriptan 100 MG tablet Commonly known as:  IMITREX 1 po at onset of migraine , may repeat in 2hours . Not to exceed 2 pills in 24hrs.   vitamin C 100 MG tablet Take 100 mg by mouth. Reported on 01/02/2016       All past medical history, surgical history, allergies, family history, immunizations andmedications were updated in the EMR today and reviewed under the history and medication portions of their EMR.     ROS: Negative, with the exception of above mentioned in HPI   Objective:  BP 114/73 (BP Location: Left Arm, Patient Position: Sitting, Cuff Size: Normal)   Pulse 67   Temp 98.8 F (37.1 C) (Oral)   Ht 5\' 4"  (1.626 m)   Wt 249 lb (112.9 kg)   LMP 10/25/2017   SpO2 99%   BMI 42.74 kg/m  Body mass index is 42.74 kg/m. Gen: Afebrile. No acute distress. Nontoxic in appearance, well developed, well nourished.  HENT: AT. [place]. Bilateral TM visualized without erythema or bulging. MMM, no oral lesions. Bilateral nares mild erythema, mild drainage. Throat without erythema or exudates. No cough or hoarseness on exam. Eyes:Pupils Equal Round Reactive to light, Extraocular movements intact,  Conjunctiva without redness, discharge or icterus. Neck/lymp/endocrine: Supple, mild bilateral anterior cervical lymphadenopathy, enlarged thyroid CV: RRR Chest: CTAB, no wheeze or crackles. Good air movement, normal resp effort.  Skin: No rashes, purpura or petechiae.  Neuro:  Normal gait. PERLA. EOMi. Alert. Oriented x3   No exam data present No results found. No results found for this or any previous visit (from the past 24 hour(s)).  Assessment/Plan: Maria Boone is a 52 y.o. female present for OV for  Enlarged thyroid/hypothyroidism Cough/Glands swollen - Patient did not appear infectious today. Possibly mild case of allergies or bronchitis secondary to has been having bronchitis. Cough that worsens is more suggestive of allergies or GERD. Patient did not respond  well to omeprazole, which should've helped his GERD related. - Cough could be lisinopril related, however she states she had fever and chills initially suggesting possible bronchitis. - Discussed all possibilities with her today, uncertain etiology. However she did have an enlarged thyroid on exam. Therefore we will repeat a thyroid panel today and order a ultrasound of her thyroid for better evaluation. - She was encouraged to start an antihistamine daily. Continue her PPI. If her symptoms continue despite this after one week, she was provided with a prescription for a Z-Pak and a  prednisone burst for her to take to cover bronchitis. - Tessalon Perles and Robitussin-DM provided for cough. - TSH - T4, free - T3, free - US THYROID; Future - Follow-up 2-4 weeks if symptoms are not resolved.   Reviewed expectations re: course of current medical issues.  Discussed self-management of symptoms.  Outlined signs and symptoms indicating need for more acute intervention.  Patient verbalized understanding and all questions were answered.  Patient received an After-Visit Summary.    No orders of the defined types were placed in this encounter.    Note is dictated utilizing voice recognition software. Although note has been proof read prior to signing, occasional typographical errors still can be missed. If any questions arise, please do not hesitate to call for verification.   electronically signed by:  Felix Pacini, DO  [facility] - [place]

## 2017-10-26 ENCOUNTER — Ambulatory Visit (HOSPITAL_BASED_OUTPATIENT_CLINIC_OR_DEPARTMENT_OTHER)
Admission: RE | Admit: 2017-10-26 | Discharge: 2017-10-26 | Disposition: A | Discharge status: Home / Self Care | Payer: 59 | Source: Ambulatory Visit | Attending: Family Medicine | Admitting: Family Medicine

## 2017-10-26 DIAGNOSIS — E034 Atrophy of thyroid (acquired): Secondary | ICD-10-CM | POA: Insufficient documentation

## 2017-10-26 DIAGNOSIS — E049 Nontoxic goiter, unspecified: Secondary | ICD-10-CM | POA: Insufficient documentation

## 2017-10-26 DIAGNOSIS — E039 Hypothyroidism, unspecified: Secondary | ICD-10-CM | POA: Insufficient documentation

## 2017-10-28 ENCOUNTER — Telehealth: Payer: Self-pay | Admitting: Family Medicine

## 2017-10-28 ENCOUNTER — Encounter: Payer: Self-pay | Admitting: *Deleted

## 2017-10-28 ENCOUNTER — Ambulatory Visit: Payer: 59 | Admitting: Family Medicine

## 2017-10-28 NOTE — Telephone Encounter (Signed)
Please call pt: - her thyroid US is normal.  - if cough remains, I would suggest the abx/prednisone printed for her at her visit.  - if cough remains after treatment follow up 2-3 weeks and we can try a different BP medication to r/o lisinopril as cause.

## 2017-10-28 NOTE — Telephone Encounter (Signed)
Left message with  [place] results ,instructions and information on patient voice mail per DPR. Also sent in [facility].

## 2017-11-08 ENCOUNTER — Telehealth: Payer: Self-pay | Admitting: Family Medicine

## 2017-11-08 MED ORDER — AMLODIPINE BESYLATE 2.5 MG PO TABS: 2.5000 mg | ORAL_TABLET | Freq: Every day | ORAL | 0 refills | Status: DC

## 2017-11-08 NOTE — Telephone Encounter (Signed)
DC lisinopril. Start amlodipine 2.5 mg QD. Follow up one week for nurse visit to recheck BP and if needed dose will be adjusted. This needs to be done for adjustment. She very well may need a higher dose, but do not want to over shoot and cause pressure too low.

## 2017-11-08 NOTE — Telephone Encounter (Signed)
Spoke with patient reviewed information and instructions. Scheduled patient for nurse visit

## 2017-11-08 NOTE — Telephone Encounter (Signed)
Copied from CRM [id number]. Topic: Quick Communication - Rx Refill/Question >> Nov 08, 2017  7:52 AM Crist Infante wrote: Medication: lisinopril (PRINIVIL,ZESTRIL) 10 MG tablet Pt states she has discussed this medicine's side effects with the dr.  Rock Nephew states she still has scratchy throat and a dry cough.  Pt does not want to take this med anymore. Pt would like the dr to change to another med.  [facility] [address] [place] - [place] [address] [phone] (Phone) [phone] (Fax)

## 2017-11-12 ENCOUNTER — Telehealth: Payer: Self-pay | Admitting: *Deleted

## 2017-11-12 NOTE — Telephone Encounter (Signed)
Pt requesting not to be seen for follow up on switch of medications and have family check for her, because she lives and an hour away.  Unfortunately, she will need to come to office. We have to see the patient and complete the exam here, by our clinical staff, to be able to adjust and manage medications.

## 2017-11-12 NOTE — Telephone Encounter (Signed)
Copied from CRM [id number]. Topic: Inquiry >> Nov 12, 2017  9:44 AM Maia Petties wrote: Reason for CRM: pt called to see if ok to have husband or son Radiation protection practitioner) take her BP at home. She lives an hour away and is hoping not to drive all the way out for her BP check 11/15/17. Please advise.  Spoke with patient she will come in but states she thinks its all about the money and see's no reason why her husband who is a IT sales professional cant take her BP. Explained to patient we have to take her pressure so we can document it and the Dr can adjust her medication. Patient requested earlier appt rescheduled patient appt.

## 2017-11-15 ENCOUNTER — Ambulatory Visit: Payer: 59

## 2017-11-15 ENCOUNTER — Ambulatory Visit (INDEPENDENT_AMBULATORY_CARE_PROVIDER_SITE_OTHER): Payer: 59

## 2017-11-15 VITALS — BP 135/76 | HR 70

## 2017-11-15 DIAGNOSIS — I10 Essential (primary) hypertension: Secondary | ICD-10-CM

## 2017-11-15 MED ORDER — AMLODIPINE BESYLATE 5 MG PO TABS: 5.0000 mg | ORAL_TABLET | Freq: Every day | ORAL | 1 refills | Status: DC

## 2017-11-15 NOTE — Addendum Note (Signed)
Addended by: Felix Pacini A on: 11/15/2017 02:18 PM   Modules accepted: Orders

## 2017-11-15 NOTE — Progress Notes (Addendum)
BP 135/76 (BP Location: Left Arm, Patient Position: Sitting, Cuff Size: Large)   Pulse 70   LMP 10/25/2017   SpO2 100% . Pt present today for blood pressure check per PCP order.  - Pt switched medications secondary to cough on ARB.  Currently on amlodipine 2.5 mg QD. PLease instruct pt to increase amlodipine to 5 mg a day. She can finish what she has by taking 2 pills a day. New script called in will be for the increased 5 mg dose (so then take one).  F/U 6 months.  Electronically Signed by: Felix Pacini, DO [facility]- [place]

## 2017-11-15 NOTE — Progress Notes (Signed)
Spoke with patient reviewed increase in amlodipine . Patient verbalized understanding.

## 2017-12-13 ENCOUNTER — Encounter: Payer: 59 | Admitting: Family Medicine

## 2017-12-20 ENCOUNTER — Encounter: Payer: 59 | Admitting: Family Medicine

## 2018-01-24 ENCOUNTER — Ambulatory Visit (INDEPENDENT_AMBULATORY_CARE_PROVIDER_SITE_OTHER): Payer: 59 | Admitting: Family Medicine

## 2018-01-24 ENCOUNTER — Encounter: Payer: Self-pay | Admitting: Family Medicine

## 2018-01-24 VITALS — BP 142/88 | HR 81 | Temp 98.2°F | Resp 20 | Ht 64.0 in | Wt 253.2 lb

## 2018-01-24 DIAGNOSIS — I10 Essential (primary) hypertension: Secondary | ICD-10-CM | POA: Diagnosis not present

## 2018-01-24 DIAGNOSIS — Z7952 Long term (current) use of systemic steroids: Secondary | ICD-10-CM | POA: Insufficient documentation

## 2018-01-24 DIAGNOSIS — M0579 Rheumatoid arthritis with rheumatoid factor of multiple sites without organ or systems involvement: Secondary | ICD-10-CM

## 2018-01-24 DIAGNOSIS — E039 Hypothyroidism, unspecified: Secondary | ICD-10-CM

## 2018-01-24 DIAGNOSIS — Z0001 Encounter for general adult medical examination with abnormal findings: Secondary | ICD-10-CM

## 2018-01-24 DIAGNOSIS — Z131 Encounter for screening for diabetes mellitus: Secondary | ICD-10-CM | POA: Diagnosis not present

## 2018-01-24 LAB — HEMOGLOBIN A1C: Hgb A1c MFr Bld: 5.1 % (ref 4.6–6.5)

## 2018-01-24 LAB — CBC WITH DIFFERENTIAL/PLATELET
Basophils Absolute: 0.1 10*3/uL (ref 0.0–0.1)
Basophils Relative: 1.5 % (ref 0.0–3.0)
EOS PCT: 1.9 % (ref 0.0–5.0)
Eosinophils Absolute: 0.1 10*3/uL (ref 0.0–0.7)
HEMATOCRIT: 40.8 % (ref 36.0–46.0)
HEMOGLOBIN: 14.1 g/dL (ref 12.0–15.0)
LYMPHS PCT: 33.7 % (ref 12.0–46.0)
Lymphs Abs: 1.8 10*3/uL (ref 0.7–4.0)
MCHC: 34.4 g/dL (ref 30.0–36.0)
MCV: 100.2 fl — AB (ref 78.0–100.0)
Monocytes Absolute: 0.4 10*3/uL (ref 0.1–1.0)
Monocytes Relative: 6.9 % (ref 3.0–12.0)
NEUTROS ABS: 3.1 10*3/uL (ref 1.4–7.7)
Neutrophils Relative %: 56 % (ref 43.0–77.0)
PLATELETS: 279 10*3/uL (ref 150.0–400.0)
RBC: 4.07 Mil/uL (ref 3.87–5.11)
RDW: 13.6 % (ref 11.5–15.5)
WBC: 5.5 10*3/uL (ref 4.0–10.5)

## 2018-01-24 LAB — COMPREHENSIVE METABOLIC PANEL
ALBUMIN: 4.5 g/dL (ref 3.5–5.2)
ALT: 24 U/L (ref 0–35)
AST: 24 U/L (ref 0–37)
Alkaline Phosphatase: 89 U/L (ref 39–117)
BUN: 13 mg/dL (ref 6–23)
CALCIUM: 9.5 mg/dL (ref 8.4–10.5)
CHLORIDE: 105 meq/L (ref 96–112)
CO2: 27 meq/L (ref 19–32)
CREATININE: 0.85 mg/dL (ref 0.40–1.20)
GFR: 74.68 mL/min (ref >60.00)
Glucose, Bld: 85 mg/dL (ref 70–99)
POTASSIUM: 4.2 meq/L (ref 3.5–5.1)
Sodium: 141 mEq/L (ref 135–145)
Total Bilirubin: 0.4 mg/dL (ref 0.2–1.2)
Total Protein: 7.5 g/dL (ref 6.0–8.3)

## 2018-01-24 LAB — LIPID PANEL
CHOLESTEROL: 207 mg/dL — AB (ref 0–200)
HDL: 59.2 mg/dL (ref >39.00)
LDL CALC: 128 mg/dL — AB (ref 0–99)
NonHDL: 147.57
TRIGLYCERIDES: 97 mg/dL (ref 0.0–149.0)
Total CHOL/HDL Ratio: 3
VLDL: 19.4 mg/dL (ref 0.0–40.0)

## 2018-01-24 LAB — TSH: TSH: 8.7 u[IU]/mL — ABNORMAL HIGH (ref 0.35–4.50)

## 2018-01-24 MED ORDER — ZOSTER VAC RECOMB ADJUVANTED 50 MCG/0.5ML IM SUSR: 0.5000 mL | Freq: Once | INTRAMUSCULAR | 1 refills | Status: AC

## 2018-01-24 MED ORDER — LOSARTAN POTASSIUM 50 MG PO TABS: 50.0000 mg | ORAL_TABLET | Freq: Every day | ORAL | 0 refills | Status: AC

## 2018-01-24 NOTE — Progress Notes (Signed)
Patient ID: Maria Boone, female  DOB: [birth year], 52 y.o.   MRN: [id number] Patient Care Team    Relationship Specialty Notifications Start End  Ma Hillock, DO PCP - General Family Medicine  10/10/15   Pennie Rushing, MD Referring Physician Orthopedic Surgery  01/02/16     Chief Complaint  Patient presents with  . Annual Exam    Subjective:  Maria Boone is a 52 y.o.  Female  present for CPE. All past medical history, surgical history, allergies, family history, immunizations, medications and social history were updated in the electronic medical record today. All recent labs, ED visits and hospitalizations within the last year were reviewed.  Hypertension/morbid obesity:  Pt reports compliance with  loaded pain 5 mg daily. Blood pressures ranges at home are checked.   She denies chest pain, shortness of breath or dizziness.  She is having mild lower extremity swelling since starting the increased dose of amlodipine.  -Lisinopril caused cough. -Amlodipine caused swelling BMP: 12/07/2016 WNL CBC: 12/07/2016 MCV 101, otherwise normal. She has labs every 3 mos at [facility] for MTX use.  Lipid: 12/07/2016 WNL Diet: Does not follow routine diet.  Exercise: not routinely  RF: HTN, obesity.   Hypothyroid: Pt feels fine. Reports compliance daily with 112 mcg levothyroxine, last TSH collected by another provider October 25, 2017 and was normal.  At that time she was having swelling to thyroid was checked.   Health maintenance: updated 01/24/18 Colonoscopy: no fhx. Completed 01/15/2017--> 10 yr follow up.  Mammogram: completed: 07/11/2017, birads 1. Yearly. Has completed at [facility] on [address] Cervical cancer screening: last pap: 09/09/2015, results: normal, HPV negative, completed by: Dr. Raoul Pitch Immunizations: tdap utd 2013, Influenza 2012 (encouraged yearly) Infectious disease screening: HIV declined DEXA: on steroids, early screening, ordered today Assistive device:  none Oxygen use: none Patient has a Dental home. Hospitalizations/ED visits: reviewed  Depression screen [facility] 2/9 01/24/2018 12/07/2016  Decreased Interest 0 0  Down, Depressed, Hopeless 0 0  PHQ - 2 Score 0 0   No flowsheet data found.   Current Exercise Habits: The patient does not participate in regular exercise at present Exercise limited by: None identified   Immunization History  Administered Date(s) Administered  . Influenza Split 06/28/2011  . Influenza Whole 06/07/2010  . Td 08/27/2006     Past Medical History:  Diagnosis Date  . Allergy   . Anemia   . Arthritis   . Hypertension   . Migraine   . Obese   . Osteoporosis   . Thyroid disease    Allergies  Allergen Reactions  . Penicillins Other (See Comments)    ORAL BLISTERS/SORES.  Marland Kitchen Amoxicillin   . Lisinopril Cough  . Percocet [Oxycodone-Acetaminophen] Itching    Nausea also  . Nitrofurantoin Monohyd Macro Rash   Past Surgical History:  Procedure Laterality Date  . CERVICAL BIOPSY  W/ LOOP ELECTRODE EXCISION    . childbirth     x2  . REPLACEMENT TOTAL KNEE  08/31/2014   Family History  Problem Relation Age of Onset  . Alzheimer's disease Mother   . Cancer Father        lymphoma, exposed to uranium  . Kidney disease Father   . Cancer Paternal Uncle        lymphoma, exposed to uranium  . Hypertension Other   . Kidney disease Other   . Lymphoma Other   . Colon cancer Neg Hx   . Colon polyps Neg Hx   .  Esophageal cancer Neg Hx   . Rectal cancer Neg Hx   . Stomach cancer Neg Hx    Social History   Socioeconomic History  . Marital status: Married    Spouse name: Not on file  . Number of children: Not on file  . Years of education: Not on file  . Highest education level: Not on file  Occupational History  . Not on file  Social Needs  . Financial resource strain: Not on file  . Food insecurity:    Worry: Not on file    Inability: Not on file  . Transportation needs:    Medical: Not on  file    Non-medical: Not on file  Tobacco Use  . Smoking status: Never Smoker  . Smokeless tobacco: Never Used  Substance and Sexual Activity  . Alcohol use: No  . Drug use: No  . Sexual activity: Yes  Lifestyle  . Physical activity:    Days per week: Not on file    Minutes per session: Not on file  . Stress: Not on file  Relationships  . Social connections:    Talks on phone: Not on file    Gets together: Not on file    Attends religious service: Not on file    Active member of club or organization: Not on file    Attends meetings of clubs or organizations: Not on file    Relationship status: Not on file  . Intimate partner violence:    Fear of current or ex partner: Not on file    Emotionally abused: Not on file    Physically abused: Not on file    Forced sexual activity: Not on file  Other Topics Concern  . Not on file  Social History Narrative   Married. William. Children 2.   Works for [employer].    Wears her seatbelt. Smoke alarm in the home. Firearms in the home (in locked cabinet).   Feels safe in her relationships.    Allergies as of 01/24/2018      Reactions   Penicillins Other (See Comments)   ORAL BLISTERS/SORES.   Amoxicillin    Lisinopril Cough   Percocet [oxycodone-acetaminophen] Itching   Nausea also   Nitrofurantoin Monohyd Macro Rash      Medication List        Accurate as of 01/24/18 12:58 PM. Always use your most recent med list.          calcium-vitamin D 250-100 MG-UNIT tablet Take 2 tablets by mouth 2 (two) times daily. 600 mg calcium and 1600 vitamin d daily   diclofenac sodium 1 % Gel Commonly known as:  VOLTAREN Apply dime size ammount 4x a day as needed for left knee pain   fluticasone 50 MCG/ACT nasal spray Commonly known as:  FLONASE Place 2 sprays into both nostrils daily.   folic acid 1 MG tablet Commonly known as:  FOLVITE Take 1 mg by mouth daily.   HUMIRA PEN 40 MG/0.4ML Pnkt Generic drug:  Adalimumab    levothyroxine 112 MCG tablet Commonly known as:  SYNTHROID, LEVOTHROID Take 1 tablet (112 mcg total) by mouth daily. On an empty stomach.   losartan 50 MG tablet Commonly known as:  COZAAR Take 1 tablet (50 mg total) by mouth daily.   LUMIGAN 0.01 % Soln Generic drug:  bimatoprost Apply 1 drop to eye at bedtime.   meloxicam 15 MG tablet Commonly known as:  MOBIC Take 1 tablet (15 mg total) by  mouth daily.   methotrexate 1 g injection Commonly known as:  50 mg/ml Inject 0.8 mg/m2 into the vein once.   predniSONE 5 MG tablet Commonly known as:  DELTASONE Take 5 mg by mouth daily with breakfast.   vitamin C 100 MG tablet Take 100 mg by mouth. Reported on 01/02/2016   Zoster Vaccine Adjuvanted injection Commonly known as:  SHINGRIX Inject 0.5 mLs into the muscle once for 1 dose. Repeat dose once in 2-6 months       All past medical history, surgical history, allergies, family history, immunizations andmedications were updated in the EMR today and reviewed under the history and medication portions of their EMR.     No results found for this or any previous visit (from the past 2160 hour(s)).  US Thyroid  Result Date: 10/26/2017 CLINICAL DATA:  Palpable abnormality. Thyromegaly. Persistent cough for the past month. History of hypothyroidism. EXAM: THYROID ULTRASOUND TECHNIQUE: Ultrasound examination of the thyroid gland and adjacent soft tissues was performed. COMPARISON:  None. FINDINGS: Parenchymal Echotexture: Markedly heterogenous Isthmus: Normal in size measures 0.1 cm in diameter Right lobe: Atrophic measuring 2.7 x 0.7 x 1.0 cm Left lobe: Atrophic measuring 2.4 x 0.5 x 0.8 cm _________________________________________________________ Estimated total number of nodules >/= 1 cm: 0 Number of spongiform nodules >/=  2 cm not described below (TR1): 0 Number of mixed cystic and solid nodules >/= 1.5 cm not described below (TR2): 0  _________________________________________________________ No discrete nodules are seen within the thyroid gland. Note is made of prominent though non pathologically enlarged bilateral cervical lymph nodes, right-sided cervical lymph node measuring 0.6 cm in greatest short axis diameter and the left-sided cervical lymph node measuring 0 9 cm. IMPRESSION: Atrophic and heterogeneous appearing thyroid gland without discrete nodule or mass - findings are nonspecific though compatible with provided history of hypothyroidism. Electronically Signed   By: Sandi Mariscal M.D.   On: 10/26/2017 13:09     ROS: 14 pt review of systems performed and negative (unless mentioned in an HPI)  Objective: BP (!) 142/88 (BP Location: Right Arm, Patient Position: Sitting, Cuff Size: Large)   Pulse 81   Temp 98.2 F (36.8 C)   Resp 20   Ht '5\' 4"'$  (1.626 m)   Wt 253 lb 4 oz (114.9 kg)   LMP 01/19/2018   SpO2 98%   BMI 43.47 kg/m  Gen: Afebrile. No acute distress. Nontoxic in appearance, well-developed, well-nourished, obese, Caucasian female. HENT: AT. [place]. Bilateral TM visualized and normal in appearance, normal external auditory canal. MMM, no oral lesions, adequate dentition. Bilateral nares within normal limits. Throat without erythema, ulcerations or exudates.  No cough on exam, no hoarseness on exam. Eyes:Pupils Equal Round Reactive to light, Extraocular movements intact,  Conjunctiva without redness, discharge or icterus. Neck/lymp/endocrine: Supple, no lymphadenopathy, no thyromegaly CV: RRR no murmur, no edema, +2/4 P posterior tibialis pulses.  No carotid bruits. No JVD. Chest: CTAB, no wheeze, rhonchi or crackles.  Normal respiratory effort.  Good air movement. Abd: Soft.  Obese. NTND. BS present.  No masses palpated. No hepatosplenomegaly. No rebound tenderness or guarding. Skin: No rashes, purpura or petechiae. Warm and well-perfused. Skin intact. Neuro/Msk:  Normal gait. PERLA. EOMi. Alert. Oriented x3.   Cranial nerves II through XII intact. Muscle strength 5/5 upper/lower extremity. DTRs equal bilaterally. Psych: Normal affect, dress and demeanor. Normal speech. Normal thought content and judgment.   No exam data present  Assessment/plan: Maria Boone is a 52 y.o. female present for  CPE. Essential hypertension/morbid obesity -Reports stable pressures at home, medication was just taken a few minutes prior to appointment today.  She also would like to change medications secondary to amlodipine causing some swelling. - Medications tried: Amlodipine caused swelling.  Lisinopril caused cough.  Had been on a mild diuretic in the past. -Routine exercise.  Dietary modifications. - CBC w/Diff - Comp Met (CMET) - Lipid panel -Trial of losartan 50 mg daily.  Patient will follow-up in 1 week at a provider appointment and dose will be titrated if she is tolerating medication, once stable follow-up every 6 months Hypothyroidism, unspecified type -Medication will be refilled once results received. - TSH Diabetes mellitus screening - HgB A1c Long term (current) use of systemic steroids -Schedule mammogram in November - DG Bone Density; Future Rheumatoid arthritis involving multiple sites with positive rheumatoid factor (HCC) Methotrexate and Humira prescribed through rheumatology. Encounter for general adult medical examination with abnormal findings Patient was encouraged to exercise greater than 150 minutes a week. Patient was encouraged to choose a diet filled with fresh fruits and vegetables, and lean meats. AVS provided to patient today for education/recommendation on gender specific health and safety maintenance. Colonoscopy: no fhx. Completed 01/15/2017--> 10 yr follow up.  Mammogram: completed: 07/11/2017, birads 1. Yearly. Has completed at [facility] on [address] Cervical cancer screening: last pap: 09/09/2015, results: normal, HPV negative, completed by: Dr. Raoul Pitch Immunizations: tdap utd  2013, Influenza 2012 (encouraged yearly) Infectious disease screening: HIV declined DEXA: on steroids, early screening, ordered today   Return in about 1 year (around 01/25/2019) for CPE. 1 week for provider appointment on BP recheck.  Electronically signed by: Howard Pouch, DO [place]

## 2018-01-24 NOTE — Patient Instructions (Signed)
Follow up 1 week for BP recheck on new med.   Health Maintenance, Female Adopting a healthy lifestyle and getting preventive care can go a long way to promote health and wellness. Talk with your health care provider about what schedule of regular examinations is right for you. This is a good chance for you to check in with your provider about disease prevention and staying healthy. In between checkups, there are plenty of things you can do on your own. Experts have done a lot of research about which lifestyle changes and preventive measures are most likely to keep you healthy. Ask your health care provider for more information. Weight and diet Eat a healthy diet  Be sure to include plenty of vegetables, fruits, low-fat dairy products, and lean protein.  Do not eat a lot of foods high in solid fats, added sugars, or salt.  Get regular exercise. This is one of the most important things you can do for your health. ? Most adults should exercise for at least 150 minutes each week. The exercise should increase your heart rate and make you sweat (moderate-intensity exercise). ? Most adults should also do strengthening exercises at least twice a week. This is in addition to the moderate-intensity exercise.  Maintain a healthy weight  Body mass index (BMI) is a measurement that can be used to identify possible weight problems. It estimates body fat based on height and weight. Your health care provider can help determine your BMI and help you achieve or maintain a healthy weight.  For females 24 years of age and older: ? A BMI below 18.5 is considered underweight. ? A BMI of 18.5 to 24.9 is normal. ? A BMI of 25 to 29.9 is considered overweight. ? A BMI of 30 and above is considered obese.  Watch levels of cholesterol and blood lipids  You should start having your blood tested for lipids and cholesterol at 52 years of age, then have this test every 5 years.  You may need to have your cholesterol  levels checked more often if: ? Your lipid or cholesterol levels are high. ? You are older than 52 years of age. ? You are at high risk for heart disease.  Cancer screening Lung Cancer  Lung cancer screening is recommended for adults 52-46 years old who are at high risk for lung cancer because of a history of smoking.  A yearly low-dose CT scan of the lungs is recommended for people who: ? Currently smoke. ? Have quit within the past 15 years. ? Have at least a 30-pack-year history of smoking. A pack year is smoking an average of one pack of cigarettes a day for 1 year.  Yearly screening should continue until it has been 15 years since you quit.  Yearly screening should stop if you develop a health problem that would prevent you from having lung cancer treatment.  Breast Cancer  Practice breast self-awareness. This means understanding how your breasts normally appear and feel.  It also means doing regular breast self-exams. Let your health care provider know about any changes, no matter how small.  If you are in your 20s or 30s, you should have a clinical breast exam (CBE) by a health care provider every 1-3 years as part of a regular health exam.  If you are 51 or older, have a CBE every year. Also consider having a breast X-ray (mammogram) every year.  If you have a family history of breast cancer, talk to your health  care provider about genetic screening.  If you are at high risk for breast cancer, talk to your health care provider about having an MRI and a mammogram every year.  Breast cancer gene (BRCA) assessment is recommended for women who have family members with BRCA-related cancers. BRCA-related cancers include: ? Breast. ? Ovarian. ? Tubal. ? Peritoneal cancers.  Results of the assessment will determine the need for genetic counseling and BRCA1 and BRCA2 testing.  Cervical Cancer Your health care provider may recommend that you be screened regularly for cancer of  the pelvic organs (ovaries, uterus, and vagina). This screening involves a pelvic examination, including checking for microscopic changes to the surface of your cervix (Pap test). You may be encouraged to have this screening done every 3 years, beginning at age 75.  For women ages 52-65, health care providers may recommend pelvic exams and Pap testing every 3 years, or they may recommend the Pap and pelvic exam, combined with testing for human papilloma virus (HPV), every 5 years. Some types of HPV increase your risk of cervical cancer. Testing for HPV may also be done on women of any age with unclear Pap test results.  Other health care providers may not recommend any screening for nonpregnant women who are considered low risk for pelvic cancer and who do not have symptoms. Ask your health care provider if a screening pelvic exam is right for you.  If you have had past treatment for cervical cancer or a condition that could lead to cancer, you need Pap tests and screening for cancer for at least 20 years after your treatment. [AGE: 52] If Pap tests have been discontinued, your risk factors (such as having a new sexual partner) need to be reassessed to determine if screening should resume. Some women have medical problems that increase the chance of getting cervical cancer. In these cases, your health care provider may recommend more frequent screening and Pap tests.  Colorectal Cancer  This type of cancer can be detected and often prevented.  Routine colorectal cancer screening usually begins at 52 years of age and continues through 52 years of age.  Your health care provider may recommend screening at an earlier age if you have risk factors for colon cancer.  Your health care provider may also recommend using home test kits to check for hidden blood in the stool.  A small camera at the end of a tube can be used to examine your colon directly (sigmoidoscopy or colonoscopy). This is done to check for the  earliest forms of colorectal cancer.  Routine screening usually begins at age 31.  Direct examination of the colon should be repeated every 5-10 years through 52 years of age. However, you may need to be screened more often if early forms of precancerous polyps or small growths are found.  Skin Cancer  Check your skin from head to toe regularly.  Tell your health care provider about any new moles or changes in moles, especially if there is a change in a mole's shape or color.  Also tell your health care provider if you have a mole that is larger than the size of a pencil eraser.  Always use sunscreen. Apply sunscreen liberally and repeatedly throughout the day.  Protect yourself by wearing long sleeves, pants, a wide-brimmed hat, and sunglasses whenever you are outside.  Heart disease, diabetes, and high blood pressure  High blood pressure causes heart disease and increases the risk of stroke. High blood pressure is more likely to develop  in: ? People who have blood pressure in the high end of the normal range (130-139/85-89 mm Hg). ? People who are overweight or obese. ? People who are African American.  If you are 48-37 years of age, have your blood pressure checked every 3-5 years. If you are 79 years of age or older, have your blood pressure checked every year. You should have your blood pressure measured twice-once when you are at a hospital or clinic, and once when you are not at a hospital or clinic. Record the average of the two measurements. To check your blood pressure when you are not at a hospital or clinic, you can use: ? An automated blood pressure machine at a pharmacy. ? A home blood pressure monitor.  If you are between 42 years and 70 years old, ask your health care provider if you should take aspirin to prevent strokes.  Have regular diabetes screenings. This involves taking a blood sample to check your fasting blood sugar level. ? If you are at a normal weight and  have a low risk for diabetes, have this test once every three years after 52 years of age. ? If you are overweight and have a high risk for diabetes, consider being tested at a younger age or more often. Preventing infection Hepatitis B  If you have a higher risk for hepatitis B, you should be screened for this virus. You are considered at high risk for hepatitis B if: ? You were born in a country where hepatitis B is common. Ask your health care provider which countries are considered high risk. ? Your parents were born in a high-risk country, and you have not been immunized against hepatitis B (hepatitis B vaccine). ? You have HIV or AIDS. ? You use needles to inject street drugs. ? You live with someone who has hepatitis B. ? You have had sex with someone who has hepatitis B. ? You get hemodialysis treatment. ? You take certain medicines for conditions, including cancer, organ transplantation, and autoimmune conditions.  Hepatitis C  Blood testing is recommended for: ? Everyone born from [birth year] through 1965. ? Anyone with known risk factors for hepatitis C.  Sexually transmitted infections (STIs)  You should be screened for sexually transmitted infections (STIs) including gonorrhea and chlamydia if: ? You are sexually active and are younger than 52 years of age. ? You are older than 52 years of age and your health care provider tells you that you are at risk for this type of infection. ? Your sexual activity has changed since you were last screened and you are at an increased risk for chlamydia or gonorrhea. Ask your health care provider if you are at risk.  If you do not have HIV, but are at risk, it may be recommended that you take a prescription medicine daily to prevent HIV infection. This is called pre-exposure prophylaxis (PrEP). You are considered at risk if: ? You are sexually active and do not regularly use condoms or know the HIV status of your partner(s). ? You take drugs by  injection. ? You are sexually active with a partner who has HIV.  Talk with your health care provider about whether you are at high risk of being infected with HIV. If you choose to begin PrEP, you should first be tested for HIV. You should then be tested every 3 months for as long as you are taking PrEP. Pregnancy  If you are premenopausal and you may become pregnant, ask  your health care provider about preconception counseling.  If you may become pregnant, take 400 to 800 micrograms (mcg) of folic acid every day.  If you want to prevent pregnancy, talk to your health care provider about birth control (contraception). Osteoporosis and menopause  Osteoporosis is a disease in which the bones lose minerals and strength with aging. This can result in serious bone fractures. Your risk for osteoporosis can be identified using a bone density scan.  If you are 19 years of age or older, or if you are at risk for osteoporosis and fractures, ask your health care provider if you should be screened.  Ask your health care provider whether you should take a calcium or vitamin D supplement to lower your risk for osteoporosis.  Menopause may have certain physical symptoms and risks.  Hormone replacement therapy may reduce some of these symptoms and risks. Talk to your health care provider about whether hormone replacement therapy is right for you. Follow these instructions at home:  Schedule regular health, dental, and eye exams.  Stay current with your immunizations.  Do not use any tobacco products including cigarettes, chewing tobacco, or electronic cigarettes.  If you are pregnant, do not drink alcohol.  If you are breastfeeding, limit how much and how often you drink alcohol.  Limit alcohol intake to no more than 1 drink per day for nonpregnant women. One drink equals 12 ounces of beer, 5 ounces of wine, or 1 ounces of hard liquor.  Do not use street drugs.  Do not share needles.  Ask  your health care provider for help if you need support or information about quitting drugs.  Tell your health care provider if you often feel depressed.  Tell your health care provider if you have ever been abused or do not feel safe at home. This information is not intended to replace advice given to you by your health care provider. Make sure you discuss any questions you have with your health care provider. Document Released: 02/26/2011 Document Revised: 01/19/2016 Document Reviewed: 05/17/2015 Elsevier Interactive Patient Education  [employer].

## 2018-01-27 ENCOUNTER — Telehealth: Payer: Self-pay | Admitting: Family Medicine

## 2018-01-27 MED ORDER — LEVOTHYROXINE SODIUM 125 MCG PO TABS: 125.0000 ug | ORAL_TABLET | Freq: Every day | ORAL | 3 refills | Status: AC

## 2018-01-27 NOTE — Telephone Encounter (Signed)
Spoke with patient she needs to keep her appt for next week with Dr Claiborne Billings we will not be retesting TSH until her 6 month follow up appt. Patient verbalized understanding.

## 2018-01-27 NOTE — Telephone Encounter (Signed)
Left detailed message with results and instructions on patient voice mail per DPR 

## 2018-01-27 NOTE — Telephone Encounter (Signed)
Copied from CRM [id number]. Topic: Quick Communication - See Telephone Encounter >> Jan 27, 2018 10:05 AM Rudi Coco, NT wrote: CRM for notification. See Telephone encounter for: 01/27/18.  Pt. Calling to see if she would need to reschedule her appt. Due to changing thyroid med. On today. Pt. Has an appt. This Friday 01/31/18. And lives an hour away pt. Would like to know if she can extend appt. Out at least a week [phone]

## 2018-01-27 NOTE — Telephone Encounter (Signed)
Please inform patient the following information: Her labs are stable. Her cholesterol is a little higher than last year, but still ok. Could consider fish oil supplement.  Her Thyroid levels are hypothyroid again. I have called in the increased dose of 125 mcg for her to start and we will follow up at her next HTN appt with repeat labs.

## 2018-01-29 ENCOUNTER — Telehealth: Payer: Self-pay | Admitting: *Deleted

## 2018-01-29 ENCOUNTER — Encounter (INDEPENDENT_AMBULATORY_CARE_PROVIDER_SITE_OTHER): Payer: Self-pay

## 2018-01-29 NOTE — Telephone Encounter (Signed)
Copied from CRM [id number]. Topic: Inquiry >> Jan 28, 2018  4:50 PM Raquel Sarna wrote: Pt needing to come in for a BP check.  Pt is needing the latest possible appt of the day.  Pt is thinking 4 pm or after if possible. Please call pt back to discuss.  >> Jan 29, 2018  8:38 AM Tomerlin, Diane S wrote: Patient misses 2 hours of work for a bp check. Patient is asking if there is another [place] office closer to her that she could transfer to that may have later appointments. I advised that there is not one closer to her.  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 Patient frustrated with having to come so often.

## 2018-01-29 NOTE — Telephone Encounter (Signed)
Maria Boone, could you please contact pt and explain need of in house BP or at least documentation by healthcare provider.  I explained this to her during her visit, twice. However, she continues to complain about needing follow ups.  Thank you.

## 2018-01-31 ENCOUNTER — Ambulatory Visit: Payer: 59 | Admitting: Family Medicine
